# Patient Record
Sex: Female | Born: 1960 | Race: White | Hispanic: No | Marital: Married | Smoking: Former smoker
Health system: Southern US, Community
[De-identification: ages and names within clinical notes are randomized; demographics above are authoritative.]

## PROBLEM LIST (undated history)

## (undated) DIAGNOSIS — M810 Age-related osteoporosis without current pathological fracture: Secondary | ICD-10-CM

## (undated) DIAGNOSIS — E079 Disorder of thyroid, unspecified: Secondary | ICD-10-CM

## (undated) DIAGNOSIS — E785 Hyperlipidemia, unspecified: Secondary | ICD-10-CM

## (undated) DIAGNOSIS — M8580 Other specified disorders of bone density and structure, unspecified site: Secondary | ICD-10-CM

## (undated) DIAGNOSIS — F419 Anxiety disorder, unspecified: Secondary | ICD-10-CM

## (undated) HISTORY — DX: Other specified disorders of bone density and structure, unspecified site: M85.80

## (undated) HISTORY — DX: Disorder of thyroid, unspecified: E07.9

## (undated) HISTORY — DX: Hyperlipidemia, unspecified: E78.5

## (undated) HISTORY — DX: Age-related osteoporosis without current pathological fracture: M81.0

## (undated) HISTORY — PX: LASER ABLATION OF THE CERVIX: SHX1949

## (undated) HISTORY — DX: Anxiety disorder, unspecified: F41.9

---

## 1998-03-31 ENCOUNTER — Other Ambulatory Visit: Admission: RE | Admit: 1998-03-31 | Discharge: 1998-03-31 | Payer: Self-pay | Admitting: Family Medicine

## 1999-07-12 ENCOUNTER — Other Ambulatory Visit: Admission: RE | Admit: 1999-07-12 | Discharge: 1999-07-12 | Payer: Self-pay | Admitting: Family Medicine

## 2000-08-28 ENCOUNTER — Other Ambulatory Visit: Admission: RE | Admit: 2000-08-28 | Discharge: 2000-08-28 | Payer: Self-pay | Admitting: Family Medicine

## 2001-09-10 ENCOUNTER — Other Ambulatory Visit: Admission: RE | Admit: 2001-09-10 | Discharge: 2001-09-10 | Payer: Self-pay | Admitting: Family Medicine

## 2003-01-01 ENCOUNTER — Other Ambulatory Visit: Admission: RE | Admit: 2003-01-01 | Discharge: 2003-01-01 | Payer: Self-pay | Admitting: *Deleted

## 2005-06-08 ENCOUNTER — Other Ambulatory Visit: Admission: RE | Admit: 2005-06-08 | Discharge: 2005-06-08 | Payer: Self-pay | Admitting: Family Medicine

## 2010-01-23 DEATH — deceased

## 2012-04-02 ENCOUNTER — Other Ambulatory Visit: Payer: Self-pay | Admitting: *Deleted

## 2012-04-02 DIAGNOSIS — E039 Hypothyroidism, unspecified: Secondary | ICD-10-CM

## 2012-04-18 ENCOUNTER — Other Ambulatory Visit (INDEPENDENT_AMBULATORY_CARE_PROVIDER_SITE_OTHER): Payer: Medicaid Other

## 2012-04-18 DIAGNOSIS — E039 Hypothyroidism, unspecified: Secondary | ICD-10-CM

## 2012-04-18 NOTE — Progress Notes (Unsigned)
Patient here for labs only. 

## 2012-04-19 ENCOUNTER — Telehealth: Payer: Self-pay | Admitting: Nurse Practitioner

## 2012-04-19 LAB — THYROID PANEL WITH TSH
T3 Uptake: 37.9 % — ABNORMAL HIGH (ref 22.5–37.0)
TSH: 1.338 u[IU]/mL (ref 0.350–4.500)

## 2012-04-19 NOTE — Telephone Encounter (Signed)
Patient aware of labs normal

## 2012-04-24 ENCOUNTER — Ambulatory Visit: Payer: Self-pay

## 2012-04-24 ENCOUNTER — Other Ambulatory Visit: Payer: Self-pay

## 2012-05-06 ENCOUNTER — Encounter: Payer: Self-pay | Admitting: Nurse Practitioner

## 2012-05-06 ENCOUNTER — Ambulatory Visit (INDEPENDENT_AMBULATORY_CARE_PROVIDER_SITE_OTHER): Payer: Medicaid Other | Admitting: Nurse Practitioner

## 2012-05-06 VITALS — BP 113/76 | HR 81 | Temp 97.4°F | Ht 64.0 in | Wt 124.0 lb

## 2012-05-06 DIAGNOSIS — E785 Hyperlipidemia, unspecified: Secondary | ICD-10-CM

## 2012-05-06 DIAGNOSIS — E039 Hypothyroidism, unspecified: Secondary | ICD-10-CM

## 2012-05-06 DIAGNOSIS — F411 Generalized anxiety disorder: Secondary | ICD-10-CM

## 2012-05-06 LAB — COMPLETE METABOLIC PANEL WITH GFR
ALT: 12 U/L (ref 0–35)
AST: 21 U/L (ref 0–37)
Albumin: 4.3 g/dL (ref 3.5–5.2)
Alkaline Phosphatase: 118 U/L — ABNORMAL HIGH (ref 39–117)
Glucose, Bld: 92 mg/dL (ref 70–99)
Potassium: 5.1 mEq/L (ref 3.5–5.3)
Sodium: 142 mEq/L (ref 135–145)
Total Protein: 6.7 g/dL (ref 6.0–8.3)

## 2012-05-06 MED ORDER — LEVOTHYROXINE SODIUM 75 MCG PO TABS: 75.0000 ug | ORAL_TABLET | Freq: Every day | ORAL | Status: DC

## 2012-05-06 MED ORDER — ATORVASTATIN CALCIUM 40 MG PO TABS: 40.0000 mg | ORAL_TABLET | Freq: Every day | ORAL | Status: DC

## 2012-05-06 NOTE — Patient Instructions (Signed)

## 2012-05-06 NOTE — Progress Notes (Signed)
  Subjective:    Patient ID: Jennifer [name], female    DOB: 12/06/1960, 52 y.o.   MRN: [id number]  Hyperlipidemia This is a chronic problem. The current episode started more than 1 year ago. The problem is controlled. Recent lipid tests were reviewed and are normal. Exacerbating diseases include hypothyroidism. Pertinent negatives include no chest pain, leg pain, myalgias or shortness of breath. Current antihyperlipidemic treatment includes statins and exercise. There are no compliance problems.   Anxiety Presents for follow-up visit. Symptoms include excessive worry, nervous/anxious behavior and panic (intermittent 1x/week). Patient reports no chest pain, decreased concentration, depressed mood, dizziness, insomnia, palpitations or shortness of breath. Symptoms occur occasionally. The most recent episode lasted 15 minutes. The severity of symptoms is moderate. The quality of sleep is good. Nighttime awakenings: none.   Compliance with medications is 76-100%. Side effects of treatment include headaches and GI discomfort.  Hypothyroidism Synthroid recently increased to 75 mcg. Pt TSH WNL. No weight or appetite changes.  Pt has no other complaints at this time.    Review of Systems  Respiratory: Negative for shortness of breath.   Cardiovascular: Negative for chest pain and palpitations.  Musculoskeletal: Negative for myalgias.  Neurological: Negative for dizziness.  Psychiatric/Behavioral: Negative for decreased concentration. The patient is nervous/anxious. The patient does not have insomnia.    BP 113/76  Pulse 81  Temp(Src) 97.4 F (36.3 C) (Oral)  Ht 5\' 4"  (1.626 m)  Wt 124 lb (56.246 kg)  BMI 21.27 kg/m2     Objective:   Physical Exam  Constitutional: She is oriented to person, place, and time. She appears well-developed and well-nourished.  HENT:  Nose: Nose normal.  Mouth/Throat: Oropharynx is clear and moist.  Eyes: EOM are normal.  Neck: Trachea normal, normal range of  motion and full passive range of motion without pain. Neck supple. No JVD present. Carotid bruit is not present. No thyromegaly present.  Cardiovascular: Normal rate, regular rhythm, normal heart sounds and intact distal pulses.  Exam reveals no gallop and no friction rub.   No murmur heard. Pulmonary/Chest: Effort normal. She has wheezes.  Abdominal: Soft. Bowel sounds are normal. She exhibits no distension and no mass. There is no tenderness.  Musculoskeletal: Normal range of motion.  Lymphadenopathy:    She has no cervical adenopathy.  Neurological: She is alert and oriented to person, place, and time. She has normal reflexes.  Skin: Skin is warm and dry.  Psychiatric: She has a normal mood and affect. Her behavior is normal. Judgment and thought content normal.          Assessment & Plan:

## 2012-05-08 LAB — NMR LIPOPROFILE WITH LIPIDS
Cholesterol, Total: 112 mg/dL (ref <200)
HDL Particle Number: 31.7 umol/L (ref >=30.5)
HDL-C: 39 mg/dL — ABNORMAL LOW (ref >=40)
Large HDL-P: 3.7 umol/L — ABNORMAL LOW (ref >=4.8)
Large VLDL-P: 3.2 nmol/L — ABNORMAL HIGH (ref <=2.7)
Triglycerides: 124 mg/dL (ref <150)

## 2012-06-12 ENCOUNTER — Ambulatory Visit: Payer: Self-pay

## 2012-06-12 ENCOUNTER — Other Ambulatory Visit: Payer: Self-pay

## 2012-08-08 ENCOUNTER — Ambulatory Visit (INDEPENDENT_AMBULATORY_CARE_PROVIDER_SITE_OTHER): Payer: Medicaid Other | Admitting: Family Medicine

## 2012-08-08 ENCOUNTER — Encounter: Payer: Self-pay | Admitting: Family Medicine

## 2012-08-08 VITALS — BP 110/67 | HR 87 | Temp 97.6°F | Wt 125.2 lb

## 2012-08-08 DIAGNOSIS — J209 Acute bronchitis, unspecified: Secondary | ICD-10-CM

## 2012-08-08 MED ORDER — AZITHROMYCIN 250 MG PO TABS: ORAL_TABLET | ORAL | Status: DC

## 2012-08-08 MED ORDER — ALBUTEROL SULFATE HFA 108 (90 BASE) MCG/ACT IN AERS: 2.0000 | INHALATION_SPRAY | Freq: Four times a day (QID) | RESPIRATORY_TRACT | Status: DC | PRN

## 2012-08-08 NOTE — Patient Instructions (Signed)
Vertigo Vertigo means you feel like you or your surroundings are moving when they are not. Vertigo can be dangerous if it occurs when you are at work, driving, or performing difficult activities.  CAUSES  Vertigo occurs when there is a conflict of signals sent to your brain from the visual and sensory systems in your body. There are many different causes of vertigo, including:  Infections, especially in the inner ear.  A bad reaction to a drug or misuse of alcohol and medicines.  Withdrawal from drugs or alcohol.  Rapidly changing positions, such as lying down or rolling over in bed.  A migraine headache.  Decreased blood flow to the brain.  Increased pressure in the brain from a head injury, infection, tumor, or bleeding. SYMPTOMS  You may feel as though the world is spinning around or you are falling to the ground. Because your balance is upset, vertigo can cause nausea and vomiting. You may have involuntary eye movements (nystagmus). DIAGNOSIS  Vertigo is usually diagnosed by physical exam. If the cause of your vertigo is unknown, your caregiver may perform imaging tests, such as an MRI scan (magnetic resonance imaging). TREATMENT  Most cases of vertigo resolve on their own, without treatment. Depending on the cause, your caregiver may prescribe certain medicines. If your vertigo is related to body position issues, your caregiver may recommend movements or procedures to correct the problem. In rare cases, if your vertigo is caused by certain inner ear problems, you may need surgery. HOME CARE INSTRUCTIONS   Follow your caregiver's instructions.  Avoid driving.  Avoid operating heavy machinery.  Avoid performing any tasks that would be dangerous to you or others during a vertigo episode.  Tell your caregiver if you notice that certain medicines seem to be causing your vertigo. Some of the medicines used to treat vertigo episodes can actually make them worse in some people. SEEK  IMMEDIATE MEDICAL CARE IF:   Your medicines do not relieve your vertigo or are making it worse.  You develop problems with talking, walking, weakness, or using your arms, hands, or legs.  You develop severe headaches.  Your nausea or vomiting continues or gets worse.  You develop visual changes.  A family member notices behavioral changes.  Your condition gets worse. MAKE SURE YOU:  Understand these instructions.  Will watch your condition.  Will get help right away if you are not doing well or get worse. Document Released: 10/19/2004 Document Revised: 04/03/2011 Document Reviewed: 07/28/2010 [employer] Patient Information 2014 [employer].  

## 2012-08-08 NOTE — Progress Notes (Signed)
  Subjective:    Patient ID: Jennifer [name], female    DOB: 01/05/1961, 52 y.o.   MRN: [id number]  HPI  This 52 y.o. female presents for evaluation of dizziness,cough, and congestion..  Review of Systems No chest pain, SOB, HA, dizziness, vision change, N/V, diarrhea, constipation, dysuria, urinary urgency or frequency, myalgias, arthralgias or rash.     Objective:   Physical Exam  Vital signs noted  Well developed well nourished female.  HEENT - Head atraumatic Normocephalic                Eyes - PERRLA, Conjuctiva - clear Sclera- Clear EOMI                Ears - EAC's Wnl TM's Wnl Gross Hearing WNL                Nose - Nares patent                 Throat - oropharanx wnl Respiratory - Lungs CTA bilateral Cardiac - RRR S1 and S2 without murmur       Assessment & Plan:  Acute bronchitis - Plan: azithromycin (ZITHROMAX) 250 MG tablet, albuterol (PROVENTIL HFA;VENTOLIN HFA) 108 (90 BASE) MCG/ACT inhaler Informed patient she should quit smoking.  Robitussin DM cough syrup recommended.  Vertigo - Drink plenty of fluid, recommend antivert but patient wants to wait.  Discussed that she should turn head slowly and Follow up prn if sx's persist.

## 2012-09-09 ENCOUNTER — Other Ambulatory Visit: Payer: Self-pay

## 2012-09-09 DIAGNOSIS — E039 Hypothyroidism, unspecified: Secondary | ICD-10-CM

## 2012-09-09 MED ORDER — LEVOTHYROXINE SODIUM 75 MCG PO TABS: 75.0000 ug | ORAL_TABLET | Freq: Every day | ORAL | Status: DC

## 2012-10-14 ENCOUNTER — Encounter: Payer: Self-pay | Admitting: Nurse Practitioner

## 2012-10-14 ENCOUNTER — Ambulatory Visit (INDEPENDENT_AMBULATORY_CARE_PROVIDER_SITE_OTHER): Payer: Medicaid Other | Admitting: Nurse Practitioner

## 2012-10-14 VITALS — BP 119/77 | HR 91 | Temp 97.8°F | Ht 64.0 in | Wt 128.0 lb

## 2012-10-14 DIAGNOSIS — E785 Hyperlipidemia, unspecified: Secondary | ICD-10-CM

## 2012-10-14 DIAGNOSIS — E039 Hypothyroidism, unspecified: Secondary | ICD-10-CM

## 2012-10-14 DIAGNOSIS — F411 Generalized anxiety disorder: Secondary | ICD-10-CM

## 2012-10-14 MED ORDER — LEVOTHYROXINE SODIUM 75 MCG PO TABS: 75.0000 ug | ORAL_TABLET | Freq: Every day | ORAL | Status: DC

## 2012-10-14 MED ORDER — ATORVASTATIN CALCIUM 40 MG PO TABS: 40.0000 mg | ORAL_TABLET | Freq: Every day | ORAL | Status: DC

## 2012-10-14 NOTE — Patient Instructions (Addendum)

## 2012-10-14 NOTE — Progress Notes (Signed)
Subjective:    Patient ID: Jennifer [name], female    DOB: Mar 17, 1960, 52 y.o.   MRN: [id number]  Hyperlipidemia This is a chronic problem. The current episode started more than 1 year ago. The problem is controlled. Recent lipid tests were reviewed and are normal. Exacerbating diseases include hypothyroidism. Pertinent negatives include no chest pain, leg pain, myalgias or shortness of breath. Current antihyperlipidemic treatment includes statins and exercise. There are no compliance problems.   Anxiety Presents for follow-up visit. Symptoms include excessive worry, nervous/anxious behavior and panic (intermittent 1x/week). Patient reports no chest pain, decreased concentration, depressed mood, dizziness, insomnia, palpitations or shortness of breath. Symptoms occur occasionally. The most recent episode lasted 15 minutes. The severity of symptoms is moderate. The quality of sleep is good. Nighttime awakenings: none.   Compliance with medications is 76-100%. Side effects of treatment include headaches and GI discomfort.  Hypothyroidism Synthroid recently increased to 75 mcg. Pt TSH WNL. No weight or appetite changes.  Pt has no other complaints at this time.    Review of Systems  Respiratory: Negative for shortness of breath.   Cardiovascular: Negative for chest pain and palpitations.  Musculoskeletal: Negative for myalgias.  Neurological: Negative for dizziness.  Psychiatric/Behavioral: Negative for decreased concentration. The patient is nervous/anxious. The patient does not have insomnia.    BP 119/77  Pulse 91  Temp(Src) 97.8 F (36.6 C) (Oral)  Ht 5\' 4"  (1.626 m)  Wt 128 lb (58.06 kg)  BMI 21.96 kg/m2     Objective:   Physical Exam  Constitutional: She is oriented to person, place, and time. She appears well-developed and well-nourished.  HENT:  Nose: Nose normal.  Mouth/Throat: Oropharynx is clear and moist.  Eyes: EOM are normal.  Neck: Trachea normal, normal range of  motion and full passive range of motion without pain. Neck supple. No JVD present. Carotid bruit is not present. No thyromegaly present.  Cardiovascular: Normal rate, regular rhythm, normal heart sounds and intact distal pulses.  Exam reveals no gallop and no friction rub.   No murmur heard. Pulmonary/Chest: Effort normal. She has wheezes.  Abdominal: Soft. Bowel sounds are normal. She exhibits no distension and no mass. There is no tenderness.  Musculoskeletal: Normal range of motion.  Lymphadenopathy:    She has no cervical adenopathy.  Neurological: She is alert and oriented to person, place, and time. She has normal reflexes.  Skin: Skin is warm and dry.  Psychiatric: She has a normal mood and affect. Her behavior is normal. Judgment and thought content normal.   BP 119/77  Pulse 91  Temp(Src) 97.8 F (36.6 C) (Oral)  Ht 5\' 4"  (1.626 m)  Wt 128 lb (58.06 kg)  BMI 21.96 kg/m2        Assessment & Plan:   1. Unspecified hypothyroidism   2. Other and unspecified hyperlipidemia   3. GAD (generalized anxiety disorder)    Orders Placed This Encounter  Procedures  . CMP14+EGFR  . NMR, lipoprofile  . Thyroid Panel With TSH   Meds ordered this encounter  Medications  . levothyroxine (SYNTHROID, LEVOTHROID) 75 MCG tablet    Sig: Take 1 tablet (75 mcg total) by mouth daily before breakfast.    Dispense:  30 tablet    Refill:  6    Order Specific Question:  Supervising Provider    Answer:  Ernestina Penna [[id number]]  . atorvastatin (LIPITOR) 40 MG tablet    Sig: Take 1 tablet (40 mg total) by mouth daily.  Dispense:  30 tablet    Refill:  5    Order Specific Question:  Supervising Provider    Answer:  Ernestina Penna [[id number]]   Contiunue current meds Labs pending Diet and exercise encouraged  Health maintenance reviewed  Mary-Margaret Daphine Deutscher, FNP

## 2012-10-16 LAB — CMP14+EGFR
ALT: 13 IU/L (ref 0–32)
AST: 22 IU/L (ref 0–40)
Albumin/Globulin Ratio: 2.4 (ref 1.1–2.5)
Alkaline Phosphatase: 133 IU/L — ABNORMAL HIGH (ref 39–117)
BUN/Creatinine Ratio: 10 (ref 9–23)
Chloride: 104 mmol/L (ref 97–108)
GFR calc Af Amer: 110 mL/min/{1.73_m2} (ref >59)
GFR calc non Af Amer: 95 mL/min/{1.73_m2} (ref >59)
Glucose: 100 mg/dL — ABNORMAL HIGH (ref 65–99)
Potassium: 4.8 mmol/L (ref 3.5–5.2)
Sodium: 144 mmol/L (ref 134–144)
Total Bilirubin: 0.4 mg/dL (ref 0.0–1.2)
Total Protein: 7.1 g/dL (ref 6.0–8.5)

## 2012-10-16 LAB — NMR, LIPOPROFILE
LDL Particle Number: 1091 nmol/L — ABNORMAL HIGH (ref <1000)
LDL Size: 19.9 nm — ABNORMAL LOW (ref >20.5)
LP-IR Score: 55 — ABNORMAL HIGH (ref <=45)
Small LDL Particle Number: 895 nmol/L — ABNORMAL HIGH (ref <=527)
Triglycerides by NMR: 138 mg/dL (ref <150)

## 2012-10-16 LAB — THYROID PANEL WITH TSH
T3 Uptake Ratio: 28 % (ref 24–39)
T4, Total: 9.2 ug/dL (ref 4.5–12.0)
TSH: 1.1 u[IU]/mL (ref 0.450–4.500)

## 2012-10-18 ENCOUNTER — Telehealth: Payer: Self-pay | Admitting: Nurse Practitioner

## 2012-10-21 NOTE — Telephone Encounter (Signed)
Aware. 

## 2013-03-10 ENCOUNTER — Ambulatory Visit: Payer: Medicaid Other | Admitting: Nurse Practitioner

## 2013-04-28 ENCOUNTER — Telehealth: Payer: Self-pay | Admitting: Nurse Practitioner

## 2013-04-28 DIAGNOSIS — E039 Hypothyroidism, unspecified: Secondary | ICD-10-CM

## 2013-04-28 DIAGNOSIS — E785 Hyperlipidemia, unspecified: Secondary | ICD-10-CM

## 2013-04-30 MED ORDER — ATORVASTATIN CALCIUM 40 MG PO TABS: 40.0000 mg | ORAL_TABLET | Freq: Every day | ORAL | Status: DC

## 2013-04-30 MED ORDER — LEVOTHYROXINE SODIUM 75 MCG PO TABS: 75.0000 ug | ORAL_TABLET | Freq: Every day | ORAL | Status: DC

## 2013-04-30 NOTE — Telephone Encounter (Signed)
done

## 2013-05-02 ENCOUNTER — Ambulatory Visit (INDEPENDENT_AMBULATORY_CARE_PROVIDER_SITE_OTHER): Payer: Medicaid Other | Admitting: Nurse Practitioner

## 2013-05-02 ENCOUNTER — Encounter: Payer: Self-pay | Admitting: Nurse Practitioner

## 2013-05-02 VITALS — BP 127/74 | HR 98 | Temp 98.1°F | Ht 64.0 in | Wt 129.2 lb

## 2013-05-02 DIAGNOSIS — F411 Generalized anxiety disorder: Secondary | ICD-10-CM

## 2013-05-02 DIAGNOSIS — E039 Hypothyroidism, unspecified: Secondary | ICD-10-CM

## 2013-05-02 DIAGNOSIS — J209 Acute bronchitis, unspecified: Secondary | ICD-10-CM

## 2013-05-02 DIAGNOSIS — E785 Hyperlipidemia, unspecified: Secondary | ICD-10-CM

## 2013-05-02 MED ORDER — ATORVASTATIN CALCIUM 40 MG PO TABS: 40.0000 mg | ORAL_TABLET | Freq: Every day | ORAL | Status: DC

## 2013-05-02 MED ORDER — ALBUTEROL SULFATE HFA 108 (90 BASE) MCG/ACT IN AERS: 2.0000 | INHALATION_SPRAY | Freq: Four times a day (QID) | RESPIRATORY_TRACT | Status: DC | PRN

## 2013-05-02 NOTE — Patient Instructions (Signed)

## 2013-05-02 NOTE — Progress Notes (Signed)
Subjective:    Patient ID: Jennifer [name], female    DOB: 29-Nov-1960, 53 y.o.   MRN: [id number]  Patient in today for follow up of chronic medical problems.  Hyperlipidemia This is a chronic problem. The current episode started more than 1 year ago. The problem is controlled. Recent lipid tests were reviewed and are normal. Exacerbating diseases include hypothyroidism. Pertinent negatives include no chest pain, leg pain, myalgias or shortness of breath. Current antihyperlipidemic treatment includes statins and exercise. There are no compliance problems.   Anxiety Presents for follow-up visit. Symptoms include excessive worry, nervous/anxious behavior and panic (intermittent 1x/week). Patient reports no chest pain, decreased concentration, depressed mood, dizziness, insomnia, palpitations or shortness of breath. Symptoms occur occasionally. The most recent episode lasted 15 minutes. The severity of symptoms is moderate. The quality of sleep is good. Nighttime awakenings: none.   Compliance with medications is 76-100%. Side effects of treatment include headaches and GI discomfort.  Hypothyroidism Synthroid recently increased to 75 mcg. Pt TSH WNL. No weight or appetite changes.      Review of Systems  Respiratory: Negative for shortness of breath.   Cardiovascular: Negative for chest pain and palpitations.  Musculoskeletal: Negative for myalgias.  Neurological: Negative for dizziness.  Psychiatric/Behavioral: Negative for decreased concentration. The patient is nervous/anxious. The patient does not have insomnia.    BP 127/74  Pulse 98  Temp(Src) 98.1 F (36.7 C) (Oral)  Ht $R'5\' 4"'ci$  (1.626 m)  Wt 129 lb 3.2 oz (58.605 kg)  BMI 22.17 kg/m2     Objective:   Physical Exam  Constitutional: She is oriented to person, place, and time. She appears well-developed and well-nourished.  HENT:  Nose: Nose normal.  Mouth/Throat: Oropharynx is clear and moist.  Eyes: EOM are normal.  Neck:  Trachea normal, normal range of motion and full passive range of motion without pain. Neck supple. No JVD present. Carotid bruit is not present. No thyromegaly present.  Cardiovascular: Normal rate, regular rhythm, normal heart sounds and intact distal pulses.  Exam reveals no gallop and no friction rub.   No murmur heard. Pulmonary/Chest: Effort normal. She has wheezes.  Abdominal: Soft. Bowel sounds are normal. She exhibits no distension and no mass. There is no tenderness.  Musculoskeletal: Normal range of motion.  Lymphadenopathy:    She has no cervical adenopathy.  Neurological: She is alert and oriented to person, place, and time. She has normal reflexes.  Skin: Skin is warm and dry.  Psychiatric: She has a normal mood and affect. Her behavior is normal. Judgment and thought content normal.   BP 127/74  Pulse 98  Temp(Src) 98.1 F (36.7 C) (Oral)  Ht $R'5\' 4"'Dz$  (1.626 m)  Wt 129 lb 3.2 oz (58.605 kg)  BMI 22.17 kg/m2        Assessment & Plan:   1. Unspecified hypothyroidism   2. Other and unspecified hyperlipidemia   3. GAD (generalized anxiety disorder)   4. Acute bronchitis    Orders Placed This Encounter  Procedures  . CMP14+EGFR  . NMR, lipoprofile  . Thyroid Panel With TSH   Meds ordered this encounter  Medications  . atorvastatin (LIPITOR) 40 MG tablet    Sig: Take 1 tablet (40 mg total) by mouth daily.    Dispense:  30 tablet    Refill:  5    Order Specific Question:  Supervising Provider    Answer:  Chipper Herb [[id number]]  . albuterol (PROVENTIL HFA;VENTOLIN HFA) 108 (90 BASE) MCG/ACT inhaler  Sig: Inhale 2 puffs into the lungs every 6 (six) hours as needed for wheezing.    Dispense:  1 Inhaler    Refill:  0    Order Specific Question:  Supervising Provider    Answer:  Chipper Herb [place] pending Health maintenance reviewed Diet and exercise encouraged Continue all meds Follow up  In 3 months   [place], FNP

## 2013-05-04 LAB — CMP14+EGFR
A/G RATIO: 2.3 (ref 1.1–2.5)
ALT: 11 IU/L (ref 0–32)
AST: 15 IU/L (ref 0–40)
Albumin: 4.5 g/dL (ref 3.5–5.5)
Alkaline Phosphatase: 125 IU/L — ABNORMAL HIGH (ref 39–117)
BUN/Creatinine Ratio: 10 (ref 9–23)
BUN: 7 mg/dL (ref 6–24)
CALCIUM: 9.1 mg/dL (ref 8.7–10.2)
CO2: 26 mmol/L (ref 18–29)
CREATININE: 0.71 mg/dL (ref 0.57–1.00)
Chloride: 105 mmol/L (ref 97–108)
GFR calc Af Amer: 111 mL/min/{1.73_m2} (ref >59)
GFR, EST NON AFRICAN AMERICAN: 96 mL/min/{1.73_m2} (ref >59)
GLUCOSE: 95 mg/dL (ref 65–99)
Globulin, Total: 2 g/dL (ref 1.5–4.5)
POTASSIUM: 3.7 mmol/L (ref 3.5–5.2)
Sodium: 146 mmol/L — ABNORMAL HIGH (ref 134–144)
TOTAL PROTEIN: 6.5 g/dL (ref 6.0–8.5)
Total Bilirubin: 0.3 mg/dL (ref 0.0–1.2)

## 2013-05-04 LAB — NMR, LIPOPROFILE
Cholesterol: 110 mg/dL (ref <200)
HDL Cholesterol by NMR: 36 mg/dL — ABNORMAL LOW (ref >=40)
HDL Particle Number: 30.3 umol/L — ABNORMAL LOW (ref >=30.5)
LDL PARTICLE NUMBER: 883 nmol/L (ref <1000)
LDL SIZE: 19.9 nm — AB (ref >20.5)
LDLC SERPL CALC-MCNC: 49 mg/dL (ref <100)
LP-IR SCORE: 61 — AB (ref <=45)
SMALL LDL PARTICLE NUMBER: 733 nmol/L — AB (ref <=527)
TRIGLYCERIDES BY NMR: 124 mg/dL (ref <150)

## 2013-05-04 LAB — THYROID PANEL WITH TSH
Free Thyroxine Index: 1.9 (ref 1.2–4.9)
T3 UPTAKE RATIO: 26 % (ref 24–39)
T4, Total: 7.4 ug/dL (ref 4.5–12.0)
TSH: 1.5 u[IU]/mL (ref 0.450–4.500)

## 2013-09-30 ENCOUNTER — Other Ambulatory Visit: Payer: Self-pay | Admitting: Nurse Practitioner

## 2013-10-03 ENCOUNTER — Encounter: Payer: Self-pay | Admitting: Nurse Practitioner

## 2013-10-03 ENCOUNTER — Ambulatory Visit (INDEPENDENT_AMBULATORY_CARE_PROVIDER_SITE_OTHER): Payer: Medicaid Other | Admitting: Nurse Practitioner

## 2013-10-03 VITALS — BP 114/66 | HR 112 | Temp 98.4°F | Ht 64.0 in | Wt 130.8 lb

## 2013-10-03 DIAGNOSIS — E785 Hyperlipidemia, unspecified: Secondary | ICD-10-CM

## 2013-10-03 DIAGNOSIS — E039 Hypothyroidism, unspecified: Secondary | ICD-10-CM

## 2013-10-03 DIAGNOSIS — F411 Generalized anxiety disorder: Secondary | ICD-10-CM

## 2013-10-03 MED ORDER — ATORVASTATIN CALCIUM 40 MG PO TABS: 40.0000 mg | ORAL_TABLET | Freq: Every day | ORAL | Status: DC

## 2013-10-03 MED ORDER — LEVOTHYROXINE SODIUM 75 MCG PO TABS: ORAL_TABLET | ORAL | Status: DC

## 2013-10-03 NOTE — Progress Notes (Signed)
Subjective:    Patient ID: Jennifer [name], female    DOB: February 08, 1960, 53 y.o.   MRN: [id number]  Patient in today for follow up of chronic medical problems. No acute complaint.  Hyperlipidemia This is a chronic problem. The current episode started more than 1 year ago. The problem is controlled. Recent lipid tests were reviewed and are normal. Exacerbating diseases include hypothyroidism. Pertinent negatives include no chest pain, leg pain, myalgias or shortness of breath. Current antihyperlipidemic treatment includes statins and exercise. There are no compliance problems.   Anxiety Presents for follow-up visit. Symptoms include excessive worry, nervous/anxious behavior and panic (intermittent 1x/week). Patient reports no chest pain, decreased concentration, depressed mood, dizziness, insomnia, palpitations or shortness of breath. Symptoms occur occasionally. The most recent episode lasted 15 minutes. The severity of symptoms is moderate. The quality of sleep is good. Nighttime awakenings: none.   Compliance with medications is 76-100%. Side effects of treatment include headaches and GI discomfort.  Hypothyroidism Synthroid recently increased to 75 mcg. Pt TSH WNL. No weight or appetite changes.     Review of Systems  Respiratory: Negative for shortness of breath.   Cardiovascular: Negative for chest pain and palpitations.  Musculoskeletal: Negative for myalgias.  Neurological: Negative for dizziness.  Psychiatric/Behavioral: Negative for decreased concentration. The patient is nervous/anxious. The patient does not have insomnia.    BP 114/66  Pulse 112  Temp(Src) 98.4 F (36.9 C) (Oral)  Ht $R'5\' 4"'qH$  (1.626 m)  Wt 130 lb 12.8 oz (59.33 kg)  BMI 22.44 kg/m2     Objective:   Physical Exam  Constitutional: She is oriented to person, place, and time. She appears well-developed and well-nourished.  HENT:  Nose: Nose normal.  Mouth/Throat: Oropharynx is clear and moist.  Eyes: EOM are  normal.  Neck: Trachea normal, normal range of motion and full passive range of motion without pain. Neck supple. No JVD present. Carotid bruit is not present. No thyromegaly present.  Cardiovascular: Normal rate, regular rhythm, normal heart sounds and intact distal pulses.  Exam reveals no gallop and no friction rub.   No murmur heard. Pulmonary/Chest: Effort normal. She has wheezes.  Abdominal: Soft. Bowel sounds are normal. She exhibits no distension and no mass. There is no tenderness.  Musculoskeletal: Normal range of motion.  Lymphadenopathy:    She has no cervical adenopathy.  Neurological: She is alert and oriented to person, place, and time. She has normal reflexes.  Skin: Skin is warm and dry.  Psychiatric: She has a normal mood and affect. Her behavior is normal. Judgment and thought content normal.   BP 114/66  Pulse 112  Temp(Src) 98.4 F (36.9 C) (Oral)  Ht $R'5\' 4"'QJ$  (1.626 m)  Wt 130 lb 12.8 oz (59.33 kg)  BMI 22.44 kg/m2        Assessment & Plan:   1. Unspecified hypothyroidism   2. Other and unspecified hyperlipidemia   3. GAD (generalized anxiety disorder)    Orders Placed This Encounter  Procedures  . CMP14+EGFR  . NMR, lipoprofile  . Thyroid Panel With TSH   Meds ordered this encounter  Medications  . atorvastatin (LIPITOR) 40 MG tablet    Sig: Take 1 tablet (40 mg total) by mouth daily.    Dispense:  30 tablet    Refill:  5    Order Specific Question:  Supervising Provider    Answer:  Chipper Herb [[id number]]  . levothyroxine (SYNTHROID, LEVOTHROID) 75 MCG tablet    Sig: TAKE 1 TABLET  DAILY BEFORE BREAKFAST    Dispense:  30 tablet    Refill:  3    Order Specific Question:  Supervising Provider    Answer:  Chipper Herb [[id number]]    hemoccult cards given to patient with directions Labs pending Health maintenance reviewed Diet and exercise encouraged Continue all meds Follow up  In 3 months    [place], FNP

## 2013-10-03 NOTE — Patient Instructions (Signed)

## 2013-10-04 LAB — CMP14+EGFR
ALBUMIN: 4.4 g/dL (ref 3.5–5.5)
ALK PHOS: 131 IU/L — AB (ref 39–117)
ALT: 14 IU/L (ref 0–32)
AST: 16 IU/L (ref 0–40)
Albumin/Globulin Ratio: 2.2 (ref 1.1–2.5)
BILIRUBIN TOTAL: 0.4 mg/dL (ref 0.0–1.2)
BUN / CREAT RATIO: 8 — AB (ref 9–23)
BUN: 6 mg/dL (ref 6–24)
CHLORIDE: 103 mmol/L (ref 97–108)
CO2: 25 mmol/L (ref 18–29)
Calcium: 9.2 mg/dL (ref 8.7–10.2)
Creatinine, Ser: 0.71 mg/dL (ref 0.57–1.00)
GFR calc non Af Amer: 96 mL/min/{1.73_m2} (ref >59)
GFR, EST AFRICAN AMERICAN: 111 mL/min/{1.73_m2} (ref >59)
Globulin, Total: 2 g/dL (ref 1.5–4.5)
Glucose: 96 mg/dL (ref 65–99)
Potassium: 3.9 mmol/L (ref 3.5–5.2)
SODIUM: 143 mmol/L (ref 134–144)
Total Protein: 6.4 g/dL (ref 6.0–8.5)

## 2013-10-04 LAB — NMR, LIPOPROFILE
Cholesterol: 108 mg/dL (ref 100–199)
HDL Cholesterol by NMR: 40 mg/dL (ref >39)
HDL PARTICLE NUMBER: 32 umol/L (ref >=30.5)
LDL Particle Number: 641 nmol/L (ref <1000)
LDL SIZE: 20.3 nm (ref >20.5)
LDLC SERPL CALC-MCNC: 46 mg/dL (ref 0–99)
LP-IR SCORE: 54 — AB (ref <=45)
SMALL LDL PARTICLE NUMBER: 291 nmol/L (ref <=527)
Triglycerides by NMR: 109 mg/dL (ref 0–149)

## 2013-10-04 LAB — THYROID PANEL WITH TSH
Free Thyroxine Index: 2.6 (ref 1.2–4.9)
T3 Uptake Ratio: 30 % (ref 24–39)
T4, Total: 8.7 ug/dL (ref 4.5–12.0)
TSH: 1.09 u[IU]/mL (ref 0.450–4.500)

## 2014-02-26 ENCOUNTER — Ambulatory Visit: Payer: Medicaid Other | Admitting: Nurse Practitioner

## 2014-03-02 ENCOUNTER — Ambulatory Visit (INDEPENDENT_AMBULATORY_CARE_PROVIDER_SITE_OTHER): Payer: Medicaid Other

## 2014-03-02 ENCOUNTER — Encounter: Payer: Self-pay | Admitting: Nurse Practitioner

## 2014-03-02 ENCOUNTER — Ambulatory Visit (INDEPENDENT_AMBULATORY_CARE_PROVIDER_SITE_OTHER): Payer: Medicaid Other | Admitting: Nurse Practitioner

## 2014-03-02 VITALS — BP 136/82 | HR 118 | Temp 97.1°F | Ht 64.0 in | Wt 127.0 lb

## 2014-03-02 DIAGNOSIS — E039 Hypothyroidism, unspecified: Secondary | ICD-10-CM

## 2014-03-02 DIAGNOSIS — E785 Hyperlipidemia, unspecified: Secondary | ICD-10-CM

## 2014-03-02 DIAGNOSIS — Z720 Tobacco use: Secondary | ICD-10-CM

## 2014-03-02 DIAGNOSIS — F17200 Nicotine dependence, unspecified, uncomplicated: Secondary | ICD-10-CM

## 2014-03-02 DIAGNOSIS — J439 Emphysema, unspecified: Secondary | ICD-10-CM

## 2014-03-02 MED ORDER — LEVOTHYROXINE SODIUM 75 MCG PO TABS: 75.0000 ug | ORAL_TABLET | Freq: Every day | ORAL | Status: DC

## 2014-03-02 MED ORDER — ALBUTEROL SULFATE HFA 108 (90 BASE) MCG/ACT IN AERS: 2.0000 | INHALATION_SPRAY | Freq: Four times a day (QID) | RESPIRATORY_TRACT | Status: DC | PRN

## 2014-03-02 MED ORDER — ATORVASTATIN CALCIUM 40 MG PO TABS: 40.0000 mg | ORAL_TABLET | Freq: Every day | ORAL | Status: DC

## 2014-03-02 MED ORDER — LEVALBUTEROL HCL 1.25 MG/3ML IN NEBU: 1.2500 mg | INHALATION_SOLUTION | RESPIRATORY_TRACT | Status: AC

## 2014-03-02 NOTE — Progress Notes (Signed)
Subjective:    Patient ID: Jennifer [name], female    DOB: 09/24/1960, 54 y.o.   MRN: [id number]  Patient in today for follow up of chronic medical problems. She is c/o wheezing mainly at night. Denies cough or congestion.She is still smoking.  Hyperlipidemia This is a chronic problem. The current episode started more than 1 year ago. The problem is uncontrolled. Recent lipid tests were reviewed and are variable. Pertinent negatives include no chest pain, myalgias or shortness of breath. Current antihyperlipidemic treatment includes statins. The current treatment provides no improvement of lipids. Compliance problems include adherence to diet and adherence to exercise.  Risk factors for coronary artery disease include dyslipidemia, hypertension and post-menopausal.  Anxiety Symptoms include nervous/anxious behavior. Patient reports no chest pain, decreased concentration, dizziness, palpitations or shortness of breath.    Thyroid Problem Presents for follow-up (hypothyroidism) visit. Symptoms include anxiety and hoarse voice. Patient reports no palpitations. The symptoms have been stable. Her past medical history is significant for hyperlipidemia.     Review of Systems  Constitutional: Negative.   HENT: Positive for hoarse voice.   Respiratory: Positive for wheezing. Negative for shortness of breath.   Cardiovascular: Negative for chest pain and palpitations.  Musculoskeletal: Negative for myalgias.  Neurological: Negative for dizziness.  Psychiatric/Behavioral: Negative for decreased concentration. The patient is nervous/anxious.         Objective:   Physical Exam  Constitutional: She is oriented to person, place, and time. She appears well-developed and well-nourished.  HENT:  Nose: Nose normal.  Mouth/Throat: Oropharynx is clear and moist.  Eyes: EOM are normal.  Neck: Trachea normal, normal range of motion and full passive range of motion without pain. Neck supple. No JVD  present. Carotid bruit is not present. No thyromegaly present.  Cardiovascular: Normal rate, regular rhythm, normal heart sounds and intact distal pulses.  Exam reveals no gallop and no friction rub.   No murmur heard. Pulmonary/Chest: Effort normal. She has wheezes.  Abdominal: Soft. Bowel sounds are normal. She exhibits no distension and no mass. There is no tenderness.  Musculoskeletal: Normal range of motion.  Lymphadenopathy:    She has no cervical adenopathy.  Neurological: She is alert and oriented to person, place, and time. She has normal reflexes.  Skin: Skin is warm and dry.  Psychiatric: She has a normal mood and affect. Her behavior is normal. Judgment and thought content normal.   BP 136/82 mmHg  Pulse 118  Temp(Src) 97.1 F (36.2 C) (Oral)  Ht 5\' 4"  (1.626 m)  Wt 127 lb (57.607 kg)  BMI 21.79 kg/m2   Chest xray- normal-Preliminary reading by Paulene FloorMary Alyssa Rotondo, FNP  [facility] Pre and post spirometry- no change with xopenex treatment-Mary-Margaret Daphine DeutscherMartin, FNP      Assessment & Plan:    1. Smoker Smoking cessation - DG Chest 2 View; Future - PR EVAL OF BRONCHOSPASM - levalbuterol (XOPENEX) nebulizer solution 1.25 mg; Take 1.25 mg by nebulization now.  2. Hyperlipidemia with target LDL less than 100 Low fat diet - CMP14+EGFR - NMR, lipoprofile - atorvastatin (LIPITOR) 40 MG tablet; Take 1 tablet (40 mg total) by mouth daily.  Dispense: 30 tablet; Refill: 5  3. Hypothyroidism, unspecified hypothyroidism type - Thyroid Panel With TSH - levothyroxine (SYNTHROID, LEVOTHROID) 75 MCG tablet; Take 1 tablet (75 mcg total) by mouth daily before breakfast.  Dispense: 30 tablet; Refill: 5  4. Pulmonary emphysema, unspecified emphysema type again encouraged smoking cessation    Labs pending Health maintenance reviewed Diet and exercise  encouraged Continue all meds Follow up  In 3 month   [place], FNP

## 2014-03-02 NOTE — Addendum Note (Signed)
Addended by: Bennie PieriniMARTIN, MARY-MARGARET on: 03/02/2014 03:23 PM   Modules accepted: Orders

## 2014-03-02 NOTE — Patient Instructions (Signed)
Smoking Cessation Quitting smoking is important to your health and has many advantages. However, it is not always easy to quit since nicotine is a very addictive drug. Oftentimes, people try 3 times or more before being able to quit. This document explains the best ways for you to prepare to quit smoking. Quitting takes hard work and a lot of effort, but you can do it. ADVANTAGES OF QUITTING SMOKING  You will live longer, feel better, and live better.  Your body will feel the impact of quitting smoking almost immediately.  Within 20 minutes, blood pressure decreases. Your pulse returns to its normal level.  After 8 hours, carbon monoxide levels in the blood return to normal. Your oxygen level increases.  After 24 hours, the chance of having a heart attack starts to decrease. Your breath, hair, and body stop smelling like smoke.  After 48 hours, damaged nerve endings begin to recover. Your sense of taste and smell improve.  After 72 hours, the body is virtually free of nicotine. Your bronchial tubes relax and breathing becomes easier.  After 2 to 12 weeks, lungs can hold more air. Exercise becomes easier and circulation improves.  The risk of having a heart attack, stroke, cancer, or lung disease is greatly reduced.  After 1 year, the risk of coronary heart disease is cut in half.  After 5 years, the risk of stroke falls to the same as a nonsmoker.  After 10 years, the risk of lung cancer is cut in half and the risk of other cancers decreases significantly.  After 15 years, the risk of coronary heart disease drops, usually to the level of a nonsmoker.  If you are pregnant, quitting smoking will improve your chances of having a healthy baby.  The people you live with, especially any children, will be healthier.  You will have extra money to spend on things other than cigarettes. QUESTIONS TO THINK ABOUT BEFORE ATTEMPTING TO QUIT You may want to talk about your answers with your  health care provider.  Why do you want to quit?  If you tried to quit in the past, what helped and what did not?  What will be the most difficult situations for you after you quit? How will you plan to handle them?  Who can help you through the tough times? Your family? Friends? A health care provider?  What pleasures do you get from smoking? What ways can you still get pleasure if you quit? Here are some questions to ask your health care provider:  How can you help me to be successful at quitting?  What medicine do you think would be best for me and how should I take it?  What should I do if I need more help?  What is smoking withdrawal like? How can I get information on withdrawal? GET READY  Set a quit date.  Change your environment by getting rid of all cigarettes, ashtrays, matches, and lighters in your home, car, or work. Do not let people smoke in your home.  Review your past attempts to quit. Think about what worked and what did not. GET SUPPORT AND ENCOURAGEMENT You have a better chance of being successful if you have help. You can get support in many ways.  Tell your family, friends, and coworkers that you are going to quit and need their support. Ask them not to smoke around you.  Get individual, group, or telephone counseling and support. Programs are available at local hospitals and health centers. Call   your local health department for information about programs in your area.  Spiritual beliefs and practices may help some smokers quit.  Download a "quit meter" on your computer to keep track of quit statistics, such as how long you have gone without smoking, cigarettes not smoked, and money saved.  Get a self-help book about quitting smoking and staying off tobacco. LEARN NEW SKILLS AND BEHAVIORS  Distract yourself from urges to smoke. Talk to someone, go for a walk, or occupy your time with a task.  Change your normal routine. Take a different route to work.  Drink tea instead of coffee. Eat breakfast in a different place.  Reduce your stress. Take a hot bath, exercise, or read a book.  Plan something enjoyable to do every day. Reward yourself for not smoking.  Explore interactive web-based programs that specialize in helping you quit. GET MEDICINE AND USE IT CORRECTLY Medicines can help you stop smoking and decrease the urge to smoke. Combining medicine with the above behavioral methods and support can greatly increase your chances of successfully quitting smoking.  Nicotine replacement therapy helps deliver nicotine to your body without the negative effects and risks of smoking. Nicotine replacement therapy includes nicotine gum, lozenges, inhalers, nasal sprays, and skin patches. Some may be available over-the-counter and others require a prescription.  Antidepressant medicine helps people abstain from smoking, but how this works is unknown. This medicine is available by prescription.  Nicotinic receptor partial agonist medicine simulates the effect of nicotine in your brain. This medicine is available by prescription. Ask your health care provider for advice about which medicines to use and how to use them based on your health history. Your health care provider will tell you what side effects to look out for if you choose to be on a medicine or therapy. Carefully read the information on the package. Do not use any other product containing nicotine while using a nicotine replacement product.  RELAPSE OR DIFFICULT SITUATIONS Most relapses occur within the first 3 months after quitting. Do not be discouraged if you start smoking again. Remember, most people try several times before finally quitting. You may have symptoms of withdrawal because your body is used to nicotine. You may crave cigarettes, be irritable, feel very hungry, cough often, get headaches, or have difficulty concentrating. The withdrawal symptoms are only temporary. They are strongest  when you first quit, but they will go away within 10-14 days. To reduce the chances of relapse, try to:  Avoid drinking alcohol. Drinking lowers your chances of successfully quitting.  Reduce the amount of caffeine you consume. Once you quit smoking, the amount of caffeine in your body increases and can give you symptoms, such as a rapid heartbeat, sweating, and anxiety.  Avoid smokers because they can make you want to smoke.  Do not let weight gain distract you. Many smokers will gain weight when they quit, usually less than 10 pounds. Eat a healthy diet and stay active. You can always lose the weight gained after you quit.  Find ways to improve your mood other than smoking. FOR MORE INFORMATION  www.smokefree.gov  Document Released: 01/03/2001 Document Revised: 05/26/2013 Document Reviewed: 04/20/2011 [employer] Patient Information 2015 [employer]. This information is not intended to replace advice given to you by your health care provider. Make sure you discuss any questions you have with your health care provider.  

## 2014-03-03 ENCOUNTER — Telehealth: Payer: Self-pay | Admitting: *Deleted

## 2014-03-03 LAB — NMR, LIPOPROFILE
Cholesterol: 116 mg/dL (ref 100–199)
HDL Cholesterol by NMR: 42 mg/dL (ref >39)
HDL Particle Number: 33.8 umol/L (ref >=30.5)
LDL PARTICLE NUMBER: 668 nmol/L (ref <1000)
LDL SIZE: 20.3 nm (ref >20.5)
LDL-C: 49 mg/dL (ref 0–99)
LP-IR Score: 51 — ABNORMAL HIGH (ref <=45)
Small LDL Particle Number: 366 nmol/L (ref <=527)
TRIGLYCERIDES BY NMR: 126 mg/dL (ref 0–149)

## 2014-03-03 LAB — CMP14+EGFR
A/G RATIO: 1.8 (ref 1.1–2.5)
ALBUMIN: 4.4 g/dL (ref 3.5–5.5)
ALT: 21 IU/L (ref 0–32)
AST: 28 IU/L (ref 0–40)
Alkaline Phosphatase: 136 IU/L — ABNORMAL HIGH (ref 39–117)
BILIRUBIN TOTAL: 0.6 mg/dL (ref 0.0–1.2)
BUN / CREAT RATIO: 10 (ref 9–23)
BUN: 7 mg/dL (ref 6–24)
CO2: 22 mmol/L (ref 18–29)
Calcium: 9.9 mg/dL (ref 8.7–10.2)
Chloride: 105 mmol/L (ref 97–108)
Creatinine, Ser: 0.67 mg/dL (ref 0.57–1.00)
GFR calc non Af Amer: 99 mL/min/{1.73_m2} (ref >59)
GFR, EST AFRICAN AMERICAN: 114 mL/min/{1.73_m2} (ref >59)
Globulin, Total: 2.5 g/dL (ref 1.5–4.5)
Glucose: 104 mg/dL — ABNORMAL HIGH (ref 65–99)
Potassium: 4.6 mmol/L (ref 3.5–5.2)
Sodium: 143 mmol/L (ref 134–144)
Total Protein: 6.9 g/dL (ref 6.0–8.5)

## 2014-03-03 LAB — THYROID PANEL WITH TSH
Free Thyroxine Index: 4.9 (ref 1.2–4.9)
T3 Uptake Ratio: 32 % (ref 24–39)
T4 TOTAL: 15.2 ug/dL — AB (ref 4.5–12.0)
TSH: 0.027 u[IU]/mL — ABNORMAL LOW (ref 0.450–4.500)

## 2014-03-03 NOTE — Telephone Encounter (Signed)
lmtcb regarding test results. 

## 2014-03-03 NOTE — Telephone Encounter (Signed)
-----   [facility] FNP sent at 03/03/2014 11:44 AM EST ----- Kidney and liver function stable Cholesterol looks great tsh low- has been on levothyroxine 75mcg daily for awhile- going to wait and recheck in 6 weeks before changing meds Continue current meds- low fat diet and exercise and recheck in 3 months

## 2014-03-04 ENCOUNTER — Telehealth: Payer: Self-pay | Admitting: Nurse Practitioner

## 2014-03-04 NOTE — Telephone Encounter (Signed)
[facility] [place] and [place] said they accidentally gave patient levothyroxine 175mcg. She is supposed to be taking 75mcg and that's what you sent in . Just wanted to touch base with you and see what she should be taking because labs were low. Please advise

## 2014-03-04 NOTE — Telephone Encounter (Signed)
Patient aware, will rck tsh in 6 weeks.

## 2014-03-04 NOTE — Telephone Encounter (Signed)
Stay on th elevothyroxine and we will recheck at next visit- not a big deal.

## 2014-03-05 ENCOUNTER — Encounter: Payer: Self-pay | Admitting: Nurse Practitioner

## 2014-03-05 NOTE — Telephone Encounter (Signed)
Spoke with patient. She is concerned that her heart rate has been rapid. Discussed with Jennifer [name]. Advised patient that she can hold levothyroxine for a few days. Avoid caffeine and strenuous activity. We will repeat thyroid panel at next visit. Follow up as needed. Patient stated understanding and agreement to plan.

## 2014-06-04 ENCOUNTER — Encounter: Payer: Self-pay | Admitting: Nurse Practitioner

## 2014-06-04 ENCOUNTER — Ambulatory Visit (INDEPENDENT_AMBULATORY_CARE_PROVIDER_SITE_OTHER): Payer: Medicaid Other

## 2014-06-04 ENCOUNTER — Encounter (INDEPENDENT_AMBULATORY_CARE_PROVIDER_SITE_OTHER): Payer: Self-pay

## 2014-06-04 ENCOUNTER — Ambulatory Visit (INDEPENDENT_AMBULATORY_CARE_PROVIDER_SITE_OTHER): Payer: Medicaid Other | Admitting: Nurse Practitioner

## 2014-06-04 VITALS — BP 121/70 | HR 92 | Temp 97.1°F | Ht 64.0 in | Wt 128.4 lb

## 2014-06-04 DIAGNOSIS — Z13820 Encounter for screening for osteoporosis: Secondary | ICD-10-CM

## 2014-06-04 DIAGNOSIS — F411 Generalized anxiety disorder: Secondary | ICD-10-CM

## 2014-06-04 DIAGNOSIS — E039 Hypothyroidism, unspecified: Secondary | ICD-10-CM | POA: Diagnosis not present

## 2014-06-04 DIAGNOSIS — J439 Emphysema, unspecified: Secondary | ICD-10-CM | POA: Diagnosis not present

## 2014-06-04 DIAGNOSIS — J411 Mucopurulent chronic bronchitis: Secondary | ICD-10-CM

## 2014-06-04 DIAGNOSIS — E785 Hyperlipidemia, unspecified: Secondary | ICD-10-CM

## 2014-06-04 MED ORDER — BUDESONIDE-FORMOTEROL FUMARATE 80-4.5 MCG/ACT IN AERO: 2.0000 | INHALATION_SPRAY | Freq: Two times a day (BID) | RESPIRATORY_TRACT | Status: DC

## 2014-06-04 MED ORDER — ALBUTEROL SULFATE HFA 108 (90 BASE) MCG/ACT IN AERS: 2.0000 | INHALATION_SPRAY | Freq: Four times a day (QID) | RESPIRATORY_TRACT | Status: DC | PRN

## 2014-06-04 MED ORDER — LEVOTHYROXINE SODIUM 75 MCG PO TABS: 75.0000 ug | ORAL_TABLET | Freq: Every day | ORAL | Status: DC

## 2014-06-04 MED ORDER — ATORVASTATIN CALCIUM 40 MG PO TABS: 40.0000 mg | ORAL_TABLET | Freq: Every day | ORAL | Status: DC

## 2014-06-04 NOTE — Progress Notes (Signed)
Subjective:    Patient ID: Jennifer [name], female    DOB: [birth year], 54 y.o.   MRN: [id number]  Patient in today for follow up of chronic medical problems.   Hyperlipidemia This is a chronic problem. The current episode started more than 1 year ago. The problem is uncontrolled. Recent lipid tests were reviewed and are variable. Pertinent negatives include no chest pain, myalgias or shortness of breath. Current antihyperlipidemic treatment includes statins. The current treatment provides no improvement of lipids. Compliance problems include adherence to diet and adherence to exercise.  Risk factors for coronary artery disease include dyslipidemia, hypertension and post-menopausal.  Anxiety Symptoms include nervous/anxious behavior. Patient reports no chest pain, decreased concentration, dizziness, palpitations or shortness of breath.    Thyroid Problem Presents for follow-up (hypothyroidism) visit. Symptoms include anxiety and hoarse voice. Patient reports no palpitations. The symptoms have been stable. Her past medical history is significant for hyperlipidemia.  COPD/smoker Uses albuterol at least 4x a week- still smoking - patient is going to stop on May 21,2016. She is determined to stop. Coughs up phlegm often  Review of Systems  Constitutional: Negative.   HENT: Positive for hoarse voice.   Respiratory: Positive for wheezing. Negative for shortness of breath.   Cardiovascular: Negative for chest pain and palpitations.  Musculoskeletal: Negative for myalgias.  Neurological: Negative for dizziness.  Psychiatric/Behavioral: Negative for decreased concentration. The patient is nervous/anxious.         Objective:   Physical Exam  Constitutional: She is oriented to person, place, and time. She appears well-developed and well-nourished.  HENT:  Nose: Nose normal.  Mouth/Throat: Oropharynx is clear and moist.  Eyes: EOM are normal.  Neck: Trachea normal, normal range of motion and  full passive range of motion without pain. Neck supple. No JVD present. Carotid bruit is not present. No thyromegaly present.  Cardiovascular: Normal rate, regular rhythm, normal heart sounds and intact distal pulses.  Exam reveals no gallop and no friction rub.   No murmur heard. Pulmonary/Chest: Effort normal. She has wheezes.  Abdominal: Soft. Bowel sounds are normal. She exhibits no distension and no mass. There is no tenderness.  Musculoskeletal: Normal range of motion.  Lymphadenopathy:    She has no cervical adenopathy.  Neurological: She is alert and oriented to person, place, and time. She has normal reflexes.  Skin: Skin is warm and dry.  Psychiatric: She has a normal mood and affect. Her behavior is normal. Judgment and thought content normal.   BP 121/70 mmHg  Pulse 92  Temp(Src) 97.1 F (36.2 C) (Oral)  Ht 5' 4" (1.626 m)  Wt 128 lb 6.4 oz (58.242 kg)  BMI 22.03 kg/m2        Assessment & Plan:    1. Hyperlipidemia with target LDL less than 100 Continue low fat diet - CMP14+EGFR - NMR, lipoprofile - TSH - atorvastatin (LIPITOR) 40 MG tablet; Take 1 tablet (40 mg total) by mouth daily.  Dispense: 30 tablet; Refill: 5  2. Hypothyroidism, unspecified hypothyroidism type - CMP14+EGFR - NMR, lipoprofile - TSH - levothyroxine (SYNTHROID, LEVOTHROID) 75 MCG tablet; Take 1 tablet (75 mcg total) by mouth daily before breakfast.  Dispense: 30 tablet; Refill: 5  3. GAD (generalized anxiety disorder) Stress management  4. Mucopurulent chronic bronchitis STOP smoking - budesonide-formoterol (SYMBICORT) 80-4.5 MCG/ACT inhaler; Inhale 2 puffs into the lungs 2 (two) times daily.  Dispense: 1 Inhaler; Refill: 5 - albuterol (PROVENTIL HFA;VENTOLIN HFA) 108 (90 BASE) MCG/ACT inhaler; Inhale 2 puffs into the  lungs every 6 (six) hours as needed for wheezing.  Dispense: 1 Inhaler; Refill: 0  5. Screening osteoporosis dexascan done today   Labs pending Health maintenance  reviewed- encouraged to do hemoccult cards- will schedule mammo and pap Diet and exercise encouraged Continue all meds Follow up  In 3 months   [place], FNP

## 2014-06-04 NOTE — Patient Instructions (Signed)
Bone Health Our bones do many things. They provide structure, protect organs, anchor muscles, and store calcium. Adequate calcium in your diet and weight-bearing physical activity help build strong bones, improve bone amounts, and may reduce the risk of weakening of bones (osteoporosis) later in life. PEAK BONE MASS By age 56, the average woman has acquired most of her skeletal bone mass. A large decline occurs in older adults which increases the risk of osteoporosis. In women this occurs around the time of menopause. It is important for young girls to reach their peak bone mass in order to maintain bone health throughout life. A person with high bone mass as a young adult will be more likely to have a higher bone mass later in life. Not enough calcium consumption and physical activity early on could result in a failure to achieve optimum bone mass in adulthood. OSTEOPOROSIS Osteoporosis is a disease of the bones. It is defined as low bone mass with deterioration of bone structure. Osteoporosis leads to an increase risk of fractures with falls. These fractures commonly happen in the wrist, hip, and spine. While men and women of all ages and background can develop osteoporosis, some of the risk factors for osteoporosis are:  Female.  White.  Postmenopausal.  Older adults.  Small in body size.  Eating a diet low in calcium.  Physically inactive.  Smoking.  Use of some medications.  Family history. CALCIUM Calcium is a mineral needed by the body for healthy bones, teeth, and proper function of the heart, muscles, and nerves. The body cannot produce calcium so it must be absorbed through food. Good sources of calcium include:  Dairy products (low fat or nonfat milk, cheese, and yogurt).  Dark green leafy vegetables (bok choy and broccoli).  Calcium fortified foods (orange juice, cereal, bread, soy beverages, and tofu products).  Nuts (almonds). Recommended amounts of calcium vary  for individuals. RECOMMENDED CALCIUM INTAKES Age and Amount in mg per day  Children 1 to 3 years / 700 mg  Children 4 to 8 years / 1,000 mg  Children 9 to 13 years / 1,300 mg  Teens 14 to 18 years / 1,300 mg  Adults 19 to 50 years / 1,000 mg  Adult women 51 to 70 years / 1,200 mg  Adults 71 years and older / 1,200 mg  Pregnant and breastfeeding teens / 1,300 mg  Pregnant and breastfeeding adults / 1,000 mg Vitamin D also plays an important role in healthy bone development. Vitamin D helps in the absorption of calcium. WEIGHT-BEARING PHYSICAL ACTIVITY Regular physical activity has many positive health benefits. Benefits include strong bones. Weight-bearing physical activity early in life is important in reaching peak bone mass. Weight-bearing physical activities cause muscles and bones to work against gravity. Some examples of weight bearing physical activities include:  Walking, jogging, or running.  Field Hockey.  Jumping rope.  Dancing.  Soccer.  Tennis or Racquetball.  Stair climbing.  Basketball.  Hiking.  Weight lifting.  Aerobic fitness classes. Including weight-bearing physical activity into an exercise plan is a great way to keep bones healthy. Adults: Engage in at least 30 minutes of moderate physical activity on most, preferably all, days of the week. Children: Engage in at least 60 minutes of moderate physical activity on most, preferably all, days of the week. FOR MORE INFORMATION [place] [employer], Center for Nutrition Policy and Promotion: www.cnpp.usda.gov National Osteoporosis Foundation: www.nof.org Document Released: 04/01/2003 Document Revised: 05/06/2012 Document Reviewed: 07/01/2008 Exit[employer] Patient Information   2015 [employer]. This information is not intended to replace advice given to you by your health care provider. Make sure you discuss any questions you have with your health care provider.  

## 2014-06-05 LAB — NMR, LIPOPROFILE
Cholesterol: 123 mg/dL (ref 100–199)
HDL Cholesterol by NMR: 41 mg/dL (ref >39)
HDL PARTICLE NUMBER: 34.3 umol/L (ref >=30.5)
LDL PARTICLE NUMBER: 909 nmol/L (ref <1000)
LDL Size: 21 nm (ref >20.5)
LDL-C: 57 mg/dL (ref 0–99)
LP-IR Score: 45 (ref <=45)
Small LDL Particle Number: 427 nmol/L (ref <=527)
Triglycerides by NMR: 126 mg/dL (ref 0–149)

## 2014-06-05 LAB — CMP14+EGFR
A/G RATIO: 1.8 (ref 1.1–2.5)
ALT: 11 IU/L (ref 0–32)
AST: 19 IU/L (ref 0–40)
Albumin: 4.6 g/dL (ref 3.5–5.5)
Alkaline Phosphatase: 139 IU/L — ABNORMAL HIGH (ref 39–117)
BILIRUBIN TOTAL: 0.5 mg/dL (ref 0.0–1.2)
BUN / CREAT RATIO: 12 (ref 9–23)
BUN: 10 mg/dL (ref 6–24)
CO2: 23 mmol/L (ref 18–29)
Calcium: 9.7 mg/dL (ref 8.7–10.2)
Chloride: 102 mmol/L (ref 97–108)
Creatinine, Ser: 0.84 mg/dL (ref 0.57–1.00)
GFR calc Af Amer: 90 mL/min/{1.73_m2} (ref >59)
GFR calc non Af Amer: 78 mL/min/{1.73_m2} (ref >59)
GLOBULIN, TOTAL: 2.5 g/dL (ref 1.5–4.5)
GLUCOSE: 97 mg/dL (ref 65–99)
Potassium: 4.7 mmol/L (ref 3.5–5.2)
SODIUM: 143 mmol/L (ref 134–144)
Total Protein: 7.1 g/dL (ref 6.0–8.5)

## 2014-06-05 LAB — TSH: TSH: 2.46 u[IU]/mL (ref 0.450–4.500)

## 2014-06-10 ENCOUNTER — Ambulatory Visit (INDEPENDENT_AMBULATORY_CARE_PROVIDER_SITE_OTHER): Payer: Medicaid Other | Admitting: Pharmacist

## 2014-06-10 ENCOUNTER — Encounter: Payer: Self-pay | Admitting: Pharmacist

## 2014-06-10 VITALS — Ht 63.0 in | Wt 128.5 lb

## 2014-06-10 DIAGNOSIS — F17200 Nicotine dependence, unspecified, uncomplicated: Secondary | ICD-10-CM

## 2014-06-10 DIAGNOSIS — M8580 Other specified disorders of bone density and structure, unspecified site: Secondary | ICD-10-CM | POA: Insufficient documentation

## 2014-06-10 DIAGNOSIS — Z720 Tobacco use: Secondary | ICD-10-CM | POA: Diagnosis not present

## 2014-06-10 NOTE — Patient Instructions (Signed)
Calcium & Vitamin D: The Facts  Why is calcium and vitamin D consumption important? Calcium: . Most Americans do not consume adequate amounts of calcium! Calcium is required for proper muscle function, nerve communication, bone support, and many other functions in the body.  . The body uses bones as a source of calcium. Bones 'remodel' themselves continuously - the body constantly breaks bone down to release calcium and rebuilds bones by replacing calcium in the bone later.  . As we get older, the rate of bone breakdown occurs faster than bone rebuilding which could lead to osteopenia, osteoporosis, and possible fractures.   Vitamin D: . People naturally make vitamin D in the body when sunlight hits the skin and triggers a process that leads to vitamin D production. This natural vitamin D production requires about 10-15 minutes of sun exposure on the hands, arms, and face at least 2-3 times per week. However, due to decreased sun exposure and the use of sunscreen, most people will need to get additional vitamin D from foods or supplements. Your doctor can measure your body's vitamin D level through a simple blood test to determine your daily vitamin D needs.  . Vitamin D is used to help the body absorb calcium, maintain bone health, help the immune system, and reduce inflammation. It also plays a role in muscle performance, balance and risk of falling.  . Vitamin D deficiency can lead to osteomalacia or softening of the bones, bone pain, and muscle weakness.   The recommended daily allowance of Calcium and Vitamin D varies for different age groups. Age group Calcium (mg) Vitamin D (IU)  Females and Males: Age 3-50 1000 mg 600 IU  Females: Age 60- 88 1200 mg 600 IU  Males: Age 8-70 1000 mg 600 IU  Females and Males: Age 40+ 1200 mg 800 IU  Pregnant/lactating Females age 69-50 1000 mg 600 IU   How much Calcium do you get in your diet? Calcium Intake # of servings per day  Total calcium (mg)   Skim milk, 2% milk (1 cup) _________ x 300 mg   Yogurt (1 small container) _________ x 200 mg   Cheese (1oz) _________ x 200 mg   Cottage Cheese (1 cup)             ________ x 150 mg   Almond milk (1 cup) _________ x 450 mg   Fortified Orange Juice (1 cup) _________ x 300 mg   Broccoli or spinach ( 1 cup) _________ x 100 mg   Salmon (3 oz) _________ x 150 mg    Almonds (1/4 cup) _______ x 90 mg      How do we get Calcium and Vitamin D in our diet? Calcium: . Obtaining calcium from the diet is the most preferred way to reach the recommended daily goal. If this goal is not reached through diet, calcium supplements are available.  . Calcium is found in many foods including: dairy products, dark leafy vegetables (like broccoli, kale, and spinach), fish, and fortified products like juices and cereals.  . The food label will have a %DV (percent daily value) listed showing the amount of calcium per serving. To determine the total mg per serving, simply replace the % with zero (0).  For example, Almond Breeze almond milk contains 45% DV of calcium or 464m per 1 cup.  . You can increase the amount of calcium in your diet by using more calcium products in your daily meals. Use yogurt and fruit to  make smoothies or use yogurt to top baked potatoes or make whipped potatoes. Sprinkle low fat cheese onto salads or into egg white omelets. You can even add non-fat dry milk powder (339m calcium per 1/3 cup) to hot cereals, meat loaf, soups, or potatoes.  . Calcium supplements come in many forms including tablets, chewables, and gummies. Be sure to read the label to determine the correct number of tablets per serving and whether or not to take the supplement with food.  . Calcium carbonate products (Oscal, Caltrate, and Viactiv) are generally better absorbed when taken with food while calcium citrate products like Citracal can be taken with or without food.  . The body can only absorb about 600 mg of calcium  at one time. It is recommended to take calcium supplements in small amounts several times per day.  However, taking it all at once is better than not taking it at all. . Increasing your intake of calcium is essential for bone health, but may also lead to some side effects like constipation, increased gas, bloating or abdominal cramping. To help reduce these side effects, start with 1 tablet per day and slowly increase your intake of the supplement to the recommended doses. It is also recommended that you drink plenty of water each day. Vitamin D: . Very few foods naturally contain vitamin D. However, it is found in saltwater fish (like tuna, salmon and mackerel), beef liver, egg yolks, cheese and vitamin D fortified foods (like yogurt, cereals, orange juice and milk) . The amount of vitamin D in each food or product is listed as %DV on the product label. To determine the total amount of vitamin D per serving, drop the % sign and multiply the number by 4. For example, 1 cup of Almond Breeze almond milk contains 25% DV vitamin D or 100 IU per serving (25 x 4 =100). . Vitamin D is also found in multivitamins and supplements and may be listed as ergocalciferol (vitamin D2) or cholecalciferol (vitamin D3). Each of these forms of vitamin D are equivalent and the daily recommended intake will vary based on your age and the vitamin D levels in your body. Follow your doctor's recommendation for vitamin D intake.       Fall Prevention and Home Safety Falls cause injuries and can affect all age groups. It is possible to use preventive measures to significantly decrease the likelihood of falls. There are many simple measures which can make your home safer and prevent falls. OUTDOORS Repair cracks and edges of walkways and driveways. Remove high doorway thresholds. Trim shrubbery on the main path into your home. Have good outside lighting. Clear walkways of tools, rocks, debris, and clutter. Check that handrails  are not broken and are securely fastened. Both sides of steps should have handrails. Have leaves, snow, and ice cleared regularly. Use sand or salt on walkways during winter months. In the garage, clean up grease or oil spills. BATHROOM Install night lights. Install grab bars by the toilet and in the tub and shower. Use non-skid mats or decals in the tub or shower. Place a plastic non-slip stool in the shower to sit on, if needed. Keep floors dry and clean up all water on the floor immediately. Remove soap buildup in the tub or shower on a regular basis. Secure bath mats with non-slip, double-sided rug tape. Remove throw rugs and tripping hazards from the floors. BEDROOMS Install night lights. Make sure a bedside light is easy to reach. Do not  use oversized bedding. Keep a telephone by your bedside. Have a firm chair with side arms to use for getting dressed. Remove throw rugs and tripping hazards from the floor. KITCHEN Keep handles on pots and pans turned toward the center of the stove. Use back burners when possible. Clean up spills quickly and allow time for drying. Avoid walking on wet floors. Avoid hot utensils and knives. Position shelves so they are not too high or low. Place commonly used objects within easy reach. If necessary, use a sturdy step stool with a grab bar when reaching. Keep electrical cables out of the way. Do not use floor polish or wax that makes floors slippery. If you must use wax, use non-skid floor wax. Remove throw rugs and tripping hazards from the floor. STAIRWAYS Never leave objects on stairs. Place handrails on both sides of stairways and use them. Fix any loose handrails. Make sure handrails on both sides of the stairways are as long as the stairs. Check carpeting to make sure it is firmly attached along stairs. Make repairs to worn or loose carpet promptly. Avoid placing throw rugs at the top or bottom of stairways, or properly secure the rug  with carpet tape to prevent slippage. Get rid of throw rugs, if possible. Have an electrician put in a light switch at the top and bottom of the stairs. OTHER FALL PREVENTION TIPS Wear low-heel or rubber-soled shoes that are supportive and fit well. Wear closed toe shoes. When using a stepladder, make sure it is fully opened and both spreaders are firmly locked. Do not climb a closed stepladder. Add color or contrast paint or tape to grab bars and handrails in your home. Place contrasting color strips on first and last steps. Learn and use mobility aids as needed. Install an electrical emergency response system. Turn on lights to avoid dark areas. Replace light bulbs that burn out immediately. Get light switches that glow. Arrange furniture to create clear pathways. Keep furniture in the same place. Firmly attach carpet with non-skid or double-sided tape. Eliminate uneven floor surfaces. Select a carpet pattern that does not visually hide the edge of steps. Be aware of all pets. OTHER HOME SAFETY TIPS Set the water temperature for 120 F (48.8 C). Keep emergency numbers on or near the telephone. Keep smoke detectors on every level of the home and near sleeping areas. Document Released: 12/30/2001 Document Revised: 07/11/2011 Document Reviewed: 03/31/2011 [employer] Patient Information 2015 [employer]. This information is not intended to replace advice given to you by your health care provider. Make sure you discuss any questions you have with your health care provider.                  Exercise for Strong Bones  Exercise is important to build and maintain strong bones / bone density.  There are 2 types of exercises that are important to building and maintaining strong bones:  Weight- bearing and muscle-stregthening.  Weight-bearing Exercises  These exercises include activities that make you move against gravity while staying upright. Weight-bearing exercises can be high-impact or  low-impact.  High-impact weight-bearing exercises help build bones and keep them strong. If you have broken a bone due to osteoporosis or are at risk of breaking a bone, you may need to avoid high-impact exercises. If you're not sure, you should check with your healthcare provider.  Examples of high-impact weight-bearing exercises are: Dancing  Doing high-impact aerobics  Hiking  Jogging/running  Jumping Rope  Stair climbing    Tennis  Low-impact weight-bearing exercises can also help keep bones strong and are a safe alternative if you cannot do high-impact exercises.   Examples of low-impact weight-bearing exercises are: Using elliptical training machines  Doing low-impact aerobics  Using stair-step machines  Fast walking on a treadmill or outside   Muscle-Strengthening Exercises These exercises include activities where you move your body, a weight or some other resistance against gravity. They are also known as resistance exercises and include: Lifting weights  Using elastic exercise bands  Using weight machines  Lifting your own body weight  Functional movements, such as standing and rising up on your toes  Yoga and Pilates can also improve strength, balance and flexibility. However, certain positions may not be safe for people with osteoporosis or those at increased risk of broken bones. For example, exercises that have you bend forward may increase the chance of breaking a bone in the spine.   Non-Impact Exercises There are other types of exercises that can help prevent falls.  Non-impact exercises can help you to improve balance, posture and how well you move in everyday activities. Some of these exercises include: Balance exercises that strengthen your legs and test your balance, such as Tai Chi, can decrease your risk of falls.  Posture exercises that improve your posture and reduce rounded or "sloping" shoulders can help you decrease the chance of breaking a bone, especially  in the spine.  Functional exercises that improve how well you move can help you with everyday activities and decrease your chance of falling and breaking a bone. For example, if you have trouble getting up from a chair or climbing stairs, you should do these activities as exercises.   **A physical therapist can teach you balance, posture and functional exercises. He/she can also help you learn which exercises are safe and appropriate for you.  [facility] has a physical therapy office in [place] in front of our office and referrals can be made for assessments and treatment as needed and strength and balance training.  If you would like to have an assessment with [facility] and our physical therapy team please let a nurse or provider know.

## 2014-06-10 NOTE — Progress Notes (Signed)
Patient ID: Jennifer LeavensLinda [name], female   DOB: Dec 18, 1960, 54 y.o.   MRN: [id number]  [facility] Current Height: Height: 5\' 3"  (160 cm)      Max Lifetime Height:  5\' 4"  Current Weight: Weight: 128 lb 8 oz (58.287 kg)       Ethnicity:Caucasian    HPI: Does pt already have a diagnosis of:  Osteopenia?  Yes Osteoporosis?  No  Back Pain?  No       Kyphosis?  No Prior fracture?  No Med(s) for Osteoporosis/Osteopenia:  none Med(s) previously tried for Osteoporosis/Osteopenia:  none                                                             PMH: Age at menopause:  54 yo Hysterectomy?  No Oophorectomy?  No HRT? No Steroid Use?  Yes - use inhaled corticosteroids daily Thyroid med?  Yes History of cancer?  No History of digestive disorders (ie Crohn's)?  No Current or previous eating disorders?  No Last Vitamin D Result:  78 (06/04/2014) Last GFR Result:  Never checked   FH/SH: Family history of osteoporosis?  No Parent with history of hip fracture?  Yes - mother Family history of breast cancer?  No Exercise?  Yes - walking 20 minutes 3 times per week Smoking?  Yes - trying to quit now Alcohol?  No    Calcium Assessment Calcium Intake  # of servings/day  Calcium mg  Milk (8 oz) 0  x  300  = 0  Yogurt (4 oz) 0 x  200 = 0  Cheese (1 oz) 0 x  200 = 0  Other Calcium sources   250mg   Ca supplement 0 = 0   Estimated calcium intake per day 250mg     DEXA Results Date of Test T-Score for AP Spine L1-L4 T-Score for Total Left Hip T-Score for Total Right Hip  06/04/2014 -2.0 -2.0 -2.2  10/07/2001 -0.8 -1.4 --             FRAX 10 year estimate: Total FX risk:  15%  (consider medication if >/= 20%) Hip FX risk:  2.0% (consider medication if >/= 3%)  Assessment: ostoepenia without high risk of fracture per FRAX estimate.   Tobacco Abuse - patient has quit date of 06/13/14 set and material at home about quitting  Recommendations: 1.   Discussed BMD  / DEXA results and  discussed fracture risk. 2.  recommend calcium 1200mg  daily through supplementation or diet.  3.  recommend weight bearing exercise - 30 minutes at least 4 days per week.   4.  Counseled and educated about fall risk and prevention. 5.  Discussed smoking cessation - patient is motivated to quit due to recent low lung function assessed by spirometry and low BMD.  She already has plan but no support person.  She is advised she can call our office if she is having cravings and I also gave her information about the 24 hour Quit Now number for smoking cessation support.  6. Added serum vitamin D to labs checked last week.   Recheck DEXA:  2 years  Time spent counseling patient:  25 minutes   [name], PharmD, CPP

## 2014-06-11 ENCOUNTER — Telehealth: Payer: Self-pay | Admitting: Nurse Practitioner

## 2014-06-11 LAB — SPECIMEN STATUS REPORT

## 2014-06-11 LAB — VITAMIN D 25 HYDROXY (VIT D DEFICIENCY, FRACTURES): Vit D, 25-Hydroxy: 25.4 ng/mL — ABNORMAL LOW (ref 30.0–100.0)

## 2014-06-11 NOTE — Telephone Encounter (Signed)
Please review and advise.

## 2014-06-11 NOTE — Telephone Encounter (Signed)
symbicort can cause thrush - need sto rinse mouth out after use. Nystatin rx sent to pharmacy

## 2014-06-11 NOTE — Telephone Encounter (Signed)
Pt notified of RX and recommendation Verbalizes understanding 

## 2014-06-12 ENCOUNTER — Other Ambulatory Visit: Payer: Self-pay | Admitting: *Deleted

## 2014-06-12 ENCOUNTER — Other Ambulatory Visit: Payer: Self-pay | Admitting: Nurse Practitioner

## 2014-06-12 MED ORDER — NYSTATIN 100000 UNIT/ML MT SUSP: 5.0000 mL | Freq: Four times a day (QID) | OROMUCOSAL | Status: DC

## 2014-06-12 MED ORDER — MAGIC MOUTHWASH: 5.0000 mL | Freq: Three times a day (TID) | ORAL | Status: DC

## 2014-09-09 ENCOUNTER — Encounter: Payer: Self-pay | Admitting: Nurse Practitioner

## 2014-09-09 ENCOUNTER — Ambulatory Visit (INDEPENDENT_AMBULATORY_CARE_PROVIDER_SITE_OTHER): Payer: Medicaid Other | Admitting: Nurse Practitioner

## 2014-09-09 VITALS — BP 109/64 | HR 89 | Temp 96.9°F | Ht 63.0 in | Wt 130.0 lb

## 2014-09-09 DIAGNOSIS — M8580 Other specified disorders of bone density and structure, unspecified site: Secondary | ICD-10-CM | POA: Diagnosis not present

## 2014-09-09 DIAGNOSIS — E785 Hyperlipidemia, unspecified: Secondary | ICD-10-CM

## 2014-09-09 DIAGNOSIS — J411 Mucopurulent chronic bronchitis: Secondary | ICD-10-CM

## 2014-09-09 DIAGNOSIS — F411 Generalized anxiety disorder: Secondary | ICD-10-CM

## 2014-09-09 DIAGNOSIS — Z1212 Encounter for screening for malignant neoplasm of rectum: Secondary | ICD-10-CM

## 2014-09-09 DIAGNOSIS — E039 Hypothyroidism, unspecified: Secondary | ICD-10-CM

## 2014-09-09 MED ORDER — ATORVASTATIN CALCIUM 40 MG PO TABS: 40.0000 mg | ORAL_TABLET | Freq: Every day | ORAL | Status: DC

## 2014-09-09 MED ORDER — LEVOTHYROXINE SODIUM 75 MCG PO TABS: 75.0000 ug | ORAL_TABLET | Freq: Every day | ORAL | Status: DC

## 2014-09-09 MED ORDER — BUDESONIDE-FORMOTEROL FUMARATE 80-4.5 MCG/ACT IN AERO: 2.0000 | INHALATION_SPRAY | Freq: Two times a day (BID) | RESPIRATORY_TRACT | Status: DC

## 2014-09-09 NOTE — Progress Notes (Signed)
Subjective:    Patient ID: Jennifer [name], female    DOB: [birth year], 54 y.o.   MRN: [id number]  Patient in today for follow up of chronic medical problems.   Hyperlipidemia This is a chronic problem. The current episode started more than 1 year ago. The problem is uncontrolled. Recent lipid tests were reviewed and are variable. Pertinent negatives include no chest pain, myalgias or shortness of breath. Current antihyperlipidemic treatment includes statins. The current treatment provides no improvement of lipids. Compliance problems include adherence to diet and adherence to exercise.  Risk factors for coronary artery disease include dyslipidemia, hypertension and post-menopausal.  Anxiety Symptoms include nervous/anxious behavior. Patient reports no chest pain, decreased concentration, dizziness, palpitations or shortness of breath.    Thyroid Problem Presents for follow-up (hypothyroidism) visit. Symptoms include anxiety and hoarse voice. Patient reports no palpitations. The symptoms have been stable. Her past medical history is significant for hyperlipidemia.  COPD/smoker Uses albuterol at least 4x a week- still smoking - patient is going to stop on May 21,2016. She is determined to stop. Coughs up phlegm often  Review of Systems  Constitutional: Negative.   HENT: Positive for hoarse voice.   Respiratory: Positive for wheezing. Negative for shortness of breath.   Cardiovascular: Negative for chest pain and palpitations.  Musculoskeletal: Negative for myalgias.  Neurological: Negative for dizziness.  Psychiatric/Behavioral: Negative for decreased concentration. The patient is nervous/anxious.         Objective:   Physical Exam  Constitutional: She is oriented to person, place, and time. She appears well-developed and well-nourished.  HENT:  Nose: Nose normal.  Mouth/Throat: Oropharynx is clear and moist.  Eyes: EOM are normal.  Neck: Trachea normal, normal range of motion and  full passive range of motion without pain. Neck supple. No JVD present. Carotid bruit is not present. No thyromegaly present.  Cardiovascular: Normal rate, regular rhythm, normal heart sounds and intact distal pulses.  Exam reveals no gallop and no friction rub.   No murmur heard. Pulmonary/Chest: Effort normal. She has wheezes.  Abdominal: Soft. Bowel sounds are normal. She exhibits no distension and no mass. There is no tenderness.  Musculoskeletal: Normal range of motion.  Lymphadenopathy:    She has no cervical adenopathy.  Neurological: She is alert and oriented to person, place, and time. She has normal reflexes.  Skin: Skin is warm and dry.  Psychiatric: She has a normal mood and affect. Her behavior is normal. Judgment and thought content normal.   BP 109/64 mmHg  Pulse 89  Temp(Src) 96.9 F (36.1 C) (Oral)  Ht $R'5\' 3"'LA$  (1.6 m)  Wt 130 lb (58.968 kg)  BMI 23.03 kg/m2        Assessment & Plan:   1. Hypothyroidism, unspecified hypothyroidism type - Thyroid Panel With TSH - levothyroxine (SYNTHROID, LEVOTHROID) 75 MCG tablet; Take 1 tablet (75 mcg total) by mouth daily before breakfast.  Dispense: 30 tablet; Refill: 5  2. Mucopurulent chronic bronchitis Continue to avoid cigarettes - budesonide-formoterol (SYMBICORT) 80-4.5 MCG/ACT inhaler; Inhale 2 puffs into the lungs 2 (two) times daily.  Dispense: 1 Inhaler; Refill: 5  3. Osteopenia Multivitamin daily with calcium and vitamin d Weight bearing exercises  4. Hyperlipidemia with target LDL less than 100 Low fat diet - CMP14+EGFR - Lipid panel - atorvastatin (LIPITOR) 40 MG tablet; Take 1 tablet (40 mg total) by mouth daily.  Dispense: 30 tablet; Refill: 5  5. GAD (generalized anxiety disorder) Stress management  6. Screening for malignant neoplasm of the  rectum - Fecal occult blood, imunochemical; Future    Labs pending Health maintenance reviewed Diet and exercise encouraged Continue all meds Follow up  In  3 month   [place], FNP

## 2014-09-09 NOTE — Patient Instructions (Signed)
Bone Health Our bones do many things. They provide structure, protect organs, anchor muscles, and store calcium. Adequate calcium in your diet and weight-bearing physical activity help build strong bones, improve bone amounts, and may reduce the risk of weakening of bones (osteoporosis) later in life. PEAK BONE MASS By age 56, the average woman has acquired most of her skeletal bone mass. A large decline occurs in older adults which increases the risk of osteoporosis. In women this occurs around the time of menopause. It is important for young girls to reach their peak bone mass in order to maintain bone health throughout life. A person with high bone mass as a young adult will be more likely to have a higher bone mass later in life. Not enough calcium consumption and physical activity early on could result in a failure to achieve optimum bone mass in adulthood. OSTEOPOROSIS Osteoporosis is a disease of the bones. It is defined as low bone mass with deterioration of bone structure. Osteoporosis leads to an increase risk of fractures with falls. These fractures commonly happen in the wrist, hip, and spine. While men and women of all ages and background can develop osteoporosis, some of the risk factors for osteoporosis are:  Female.  White.  Postmenopausal.  Older adults.  Small in body size.  Eating a diet low in calcium.  Physically inactive.  Smoking.  Use of some medications.  Family history. CALCIUM Calcium is a mineral needed by the body for healthy bones, teeth, and proper function of the heart, muscles, and nerves. The body cannot produce calcium so it must be absorbed through food. Good sources of calcium include:  Dairy products (low fat or nonfat milk, cheese, and yogurt).  Dark green leafy vegetables (bok choy and broccoli).  Calcium fortified foods (orange juice, cereal, bread, soy beverages, and tofu products).  Nuts (almonds). Recommended amounts of calcium vary  for individuals. RECOMMENDED CALCIUM INTAKES Age and Amount in mg per day  Children 1 to 3 years / 700 mg  Children 4 to 8 years / 1,000 mg  Children 9 to 13 years / 1,300 mg  Teens 14 to 18 years / 1,300 mg  Adults 19 to 50 years / 1,000 mg  Adult women 51 to 70 years / 1,200 mg  Adults 71 years and older / 1,200 mg  Pregnant and breastfeeding teens / 1,300 mg  Pregnant and breastfeeding adults / 1,000 mg Vitamin D also plays an important role in healthy bone development. Vitamin D helps in the absorption of calcium. WEIGHT-BEARING PHYSICAL ACTIVITY Regular physical activity has many positive health benefits. Benefits include strong bones. Weight-bearing physical activity early in life is important in reaching peak bone mass. Weight-bearing physical activities cause muscles and bones to work against gravity. Some examples of weight bearing physical activities include:  Walking, jogging, or running.  Field Hockey.  Jumping rope.  Dancing.  Soccer.  Tennis or Racquetball.  Stair climbing.  Basketball.  Hiking.  Weight lifting.  Aerobic fitness classes. Including weight-bearing physical activity into an exercise plan is a great way to keep bones healthy. Adults: Engage in at least 30 minutes of moderate physical activity on most, preferably all, days of the week. Children: Engage in at least 60 minutes of moderate physical activity on most, preferably all, days of the week. FOR MORE INFORMATION [place] [employer], Center for Nutrition Policy and Promotion: www.cnpp.usda.gov National Osteoporosis Foundation: www.nof.org Document Released: 04/01/2003 Document Revised: 05/06/2012 Document Reviewed: 07/01/2008 Exit[employer] Patient Information   2015 [employer]. This information is not intended to replace advice given to you by your health care provider. Make sure you discuss any questions you have with your health care provider.  

## 2014-09-10 LAB — THYROID PANEL WITH TSH
FREE THYROXINE INDEX: 2.7 (ref 1.2–4.9)
T3 UPTAKE RATIO: 29 % (ref 24–39)
T4, Total: 9.3 ug/dL (ref 4.5–12.0)
TSH: 1.09 u[IU]/mL (ref 0.450–4.500)

## 2014-09-10 LAB — CMP14+EGFR
A/G RATIO: 2.2 (ref 1.1–2.5)
ALK PHOS: 124 IU/L — AB (ref 39–117)
ALT: 11 IU/L (ref 0–32)
AST: 18 IU/L (ref 0–40)
Albumin: 4.6 g/dL (ref 3.5–5.5)
BILIRUBIN TOTAL: 0.4 mg/dL (ref 0.0–1.2)
BUN / CREAT RATIO: 11 (ref 9–23)
BUN: 9 mg/dL (ref 6–24)
CHLORIDE: 103 mmol/L (ref 97–108)
CO2: 25 mmol/L (ref 18–29)
Calcium: 9.7 mg/dL (ref 8.7–10.2)
Creatinine, Ser: 0.85 mg/dL (ref 0.57–1.00)
GFR calc Af Amer: 89 mL/min/{1.73_m2} (ref >59)
GFR calc non Af Amer: 77 mL/min/{1.73_m2} (ref >59)
GLUCOSE: 93 mg/dL (ref 65–99)
Globulin, Total: 2.1 g/dL (ref 1.5–4.5)
Potassium: 4.7 mmol/L (ref 3.5–5.2)
Sodium: 142 mmol/L (ref 134–144)
Total Protein: 6.7 g/dL (ref 6.0–8.5)

## 2014-09-10 LAB — LIPID PANEL
CHOL/HDL RATIO: 3.4 ratio (ref 0.0–4.4)
Cholesterol, Total: 127 mg/dL (ref 100–199)
HDL: 37 mg/dL — ABNORMAL LOW (ref >39)
LDL CALC: 61 mg/dL (ref 0–99)
TRIGLYCERIDES: 145 mg/dL (ref 0–149)
VLDL Cholesterol Cal: 29 mg/dL (ref 5–40)

## 2014-12-18 ENCOUNTER — Ambulatory Visit: Payer: Medicaid Other | Admitting: Nurse Practitioner

## 2015-01-01 ENCOUNTER — Ambulatory Visit (INDEPENDENT_AMBULATORY_CARE_PROVIDER_SITE_OTHER): Payer: Medicaid Other | Admitting: Nurse Practitioner

## 2015-01-01 ENCOUNTER — Encounter: Payer: Self-pay | Admitting: Nurse Practitioner

## 2015-01-01 VITALS — BP 113/72 | HR 82 | Temp 96.8°F | Ht 63.0 in | Wt 129.0 lb

## 2015-01-01 DIAGNOSIS — E785 Hyperlipidemia, unspecified: Secondary | ICD-10-CM | POA: Diagnosis not present

## 2015-01-01 DIAGNOSIS — E039 Hypothyroidism, unspecified: Secondary | ICD-10-CM

## 2015-01-01 DIAGNOSIS — M8580 Other specified disorders of bone density and structure, unspecified site: Secondary | ICD-10-CM

## 2015-01-01 DIAGNOSIS — Z1212 Encounter for screening for malignant neoplasm of rectum: Secondary | ICD-10-CM | POA: Diagnosis not present

## 2015-01-01 DIAGNOSIS — J411 Mucopurulent chronic bronchitis: Secondary | ICD-10-CM | POA: Diagnosis not present

## 2015-01-01 DIAGNOSIS — F411 Generalized anxiety disorder: Secondary | ICD-10-CM | POA: Diagnosis not present

## 2015-01-01 DIAGNOSIS — Z1159 Encounter for screening for other viral diseases: Secondary | ICD-10-CM

## 2015-01-01 MED ORDER — BUDESONIDE-FORMOTEROL FUMARATE 80-4.5 MCG/ACT IN AERO: 2.0000 | INHALATION_SPRAY | Freq: Two times a day (BID) | RESPIRATORY_TRACT | Status: DC

## 2015-01-01 MED ORDER — ATORVASTATIN CALCIUM 40 MG PO TABS: 40.0000 mg | ORAL_TABLET | Freq: Every day | ORAL | Status: DC

## 2015-01-01 MED ORDER — LEVOTHYROXINE SODIUM 75 MCG PO TABS: 75.0000 ug | ORAL_TABLET | Freq: Every day | ORAL | Status: DC

## 2015-01-01 NOTE — Progress Notes (Signed)
Subjective:    Patient ID: Jennifer [name], female    DOB: 09/09/1960, 54 y.o.   MRN: [id number]  Patient in today for follow up of chronic medical problems.   Hyperlipidemia This is a chronic problem. The current episode started more than 1 year ago. The problem is uncontrolled. Recent lipid tests were reviewed and are variable. Pertinent negatives include no chest pain, myalgias or shortness of breath. Current antihyperlipidemic treatment includes statins. The current treatment provides no improvement of lipids. Compliance problems include adherence to diet and adherence to exercise.  Risk factors for coronary artery disease include dyslipidemia, hypertension and post-menopausal.  Anxiety Visit type: Sees [facility]. Symptoms include nervous/anxious behavior. Patient reports no chest pain, decreased concentration, dizziness, palpitations or shortness of breath.    Thyroid Problem Presents for follow-up (hypothyroidism) visit. Symptoms include anxiety and hoarse voice. Patient reports no palpitations. The symptoms have been stable. Her past medical history is significant for hyperlipidemia.  COPD/smoker Uses albuterol at least 4x a week- Patient stopped smoking in May- doing well- husband smokes so it make sit very difficult but she is doing well.    Review of Systems  Constitutional: Negative.   HENT: Positive for hoarse voice.   Respiratory: Positive for wheezing. Negative for shortness of breath.   Cardiovascular: Negative for chest pain and palpitations.  Musculoskeletal: Negative for myalgias.  Neurological: Negative for dizziness.  Psychiatric/Behavioral: Negative for decreased concentration. The patient is nervous/anxious.         Objective:   Physical Exam  Constitutional: She is oriented to person, place, and time. She appears well-developed and well-nourished.  HENT:  Nose: Nose normal.  Mouth/Throat: Oropharynx is clear and moist.  Eyes: EOM are normal.  Neck: Trachea  normal, normal range of motion and full passive range of motion without pain. Neck supple. No JVD present. Carotid bruit is not present. No thyromegaly present.  Cardiovascular: Normal rate, regular rhythm, normal heart sounds and intact distal pulses.  Exam reveals no gallop and no friction rub.   No murmur heard. Pulmonary/Chest: Effort normal. She has wheezes.  Abdominal: Soft. Bowel sounds are normal. She exhibits no distension and no mass. There is no tenderness.  Musculoskeletal: Normal range of motion.  Lymphadenopathy:    She has no cervical adenopathy.  Neurological: She is alert and oriented to person, place, and time. She has normal reflexes.  Skin: Skin is warm and dry.  Psychiatric: She has a normal mood and affect. Her behavior is normal. Judgment and thought content normal.   BP 113/72 mmHg  Pulse 82  Temp(Src) 96.8 F (36 C) (Oral)  Ht $R'5\' 3"'jL$  (1.6 m)  Wt 129 lb (58.514 kg)  BMI 22.86 kg/m2        Assessment & Plan:  1. Mucopurulent chronic bronchitis (HCC) Continue  To avoid cigarettes - budesonide-formoterol (SYMBICORT) 80-4.5 MCG/ACT inhaler; Inhale 2 puffs into the lungs 2 (two) times daily.  Dispense: 1 Inhaler; Refill: 5  2. Hypothyroidism, unspecified hypothyroidism type - levothyroxine (SYNTHROID, LEVOTHROID) 75 MCG tablet; Take 1 tablet (75 mcg total) by mouth daily before breakfast.  Dispense: 30 tablet; Refill: 5  3. Osteopenia Weight bearing exercises  4. Hyperlipidemia with target LDL less than 100 Low fat diet - CMP14+EGFR - Lipid panel - atorvastatin (LIPITOR) 40 MG tablet; Take 1 tablet (40 mg total) by mouth daily.  Dispense: 30 tablet; Refill: 5  5. GAD (generalized anxiety disorder) Stress management Keep follow up appointments with [facility]  6. Screening for malignant neoplasm  of the rectum - Fecal occult blood, imunochemical; Future  7. Need for hepatitis C screening test - Hepatitis C antibody   Patient to make appointment for  mammogram Labs pending Health maintenance reviewed Diet and exercise encouraged Continue all meds Follow up  In 6 month   [place], FNP

## 2015-01-01 NOTE — Patient Instructions (Signed)
Bone Health Bones protect organs, store calcium, and anchor muscles. Good health habits, such as eating nutritious foods and exercising regularly, are important for maintaining healthy bones. They can also help to prevent a condition that causes bones to lose density and become weak and brittle (osteoporosis). WHY IS BONE MASS IMPORTANT? Bone mass refers to the amount of bone tissue that you have. The higher your bone mass, the stronger your bones. An important step toward having healthy bones throughout life is to have strong and dense bones during childhood. A young adult who has a high bone mass is more likely to have a high bone mass later in life. Bone mass at its greatest it is called peak bone mass. A large decline in bone mass occurs in older adults. In women, it occurs about the time of menopause. During this time, it is important to practice good health habits, because if more bone is lost than what is replaced, the bones will become less healthy and more likely to break (fracture). If you find that you have a low bone mass, you may be able to prevent osteoporosis or further bone loss by changing your diet and lifestyle. HOW CAN I FIND OUT IF MY BONE MASS IS LOW? Bone mass can be measured with an X-ray test that is called a bone mineral density (BMD) test. This test is recommended for all women who are age 65 or older. It may also be recommended for men who are age 70 or older, or for people who are more likely to develop osteoporosis due to:  Having bones that break easily.  Having a long-term disease that weakens bones, such as kidney disease or rheumatoid arthritis.  Having menopause earlier than normal.  Taking medicine that weakens bones, such as steroids, thyroid hormones, or hormone treatment for breast cancer or prostate cancer.  Smoking.  Drinking three or more alcoholic drinks each day. WHAT ARE THE NUTRITIONAL RECOMMENDATIONS FOR HEALTHY BONES? To have healthy bones, you need  to get enough of the right minerals and vitamins. Most nutrition experts recommend getting these nutrients from the foods that you eat. Nutritional recommendations vary from person to person. Ask your health care provider what is healthy for you. Here are some general guidelines. Calcium Recommendations Calcium is the most important (essential) mineral for bone health. Most people can get enough calcium from their diet, but supplements may be recommended for people who are at risk for osteoporosis. Good sources of calcium include:  Dairy products, such as low-fat or nonfat milk, cheese, and yogurt.  Dark green leafy vegetables, such as bok choy and broccoli.  Calcium-fortified foods, such as orange juice, cereal, bread, soy beverages, and tofu products.  Nuts, such as almonds. Follow these recommended amounts for daily calcium intake:  Children, age 1-3: 700 mg.  Children, age 4-8: 1,000 mg.  Children, age 9-13: 1,300 mg.  Teens, age 14-18: 1,300 mg.  Adults, age 19-50: 1,000 mg.  Adults, age 51-70:  Men: 1,000 mg.  Women: 1,200 mg.  Adults, age 71 or older: 1,200 mg.  Pregnant and breastfeeding females:  Teens: 1,300 mg.  Adults: 1,000 mg. Vitamin D Recommendations Vitamin D is the most essential vitamin for bone health. It helps the body to absorb calcium. Sunlight stimulates the skin to make vitamin D, so be sure to get enough sunlight. If you live in a cold climate or you do not get outside often, your health care provider may recommend that you take vitamin D supplements. Good   sources of vitamin D in your diet include:  Egg yolks.  Saltwater fish.  Milk and cereal fortified with vitamin D. Follow these recommended amounts for daily vitamin D intake:  Children and teens, age 1-18: 600 international units.  Adults, age 50 or younger: 400-800 international units.  Adults, age 51 or older: 800-1,000 international units. Other Nutrients Other nutrients for bone  health include:  Phosphorus. This mineral is found in meat, poultry, dairy foods, nuts, and legumes. The recommended daily intake for adult men and adult women is 700 mg.  Magnesium. This mineral is found in seeds, nuts, dark green vegetables, and legumes. The recommended daily intake for adult men is 400-420 mg. For adult women, it is 310-320 mg.  Vitamin K. This vitamin is found in green leafy vegetables. The recommended daily intake is 120 mg for adult men and 90 mg for adult women. WHAT TYPE OF PHYSICAL ACTIVITY IS BEST FOR BUILDING AND MAINTAINING HEALTHY BONES? Weight-bearing and strength-building activities are important for building and maintaining peak bone mass. Weight-bearing activities cause muscles and bones to work against gravity. Strength-building activities increases muscle strength that supports bones. Weight-bearing and muscle-building activities include:  Walking and hiking.  Jogging and running.  Dancing.  Gym exercises.  Lifting weights.  Tennis and racquetball.  Climbing stairs.  Aerobics. Adults should get at least 30 minutes of moderate physical activity on most days. Children should get at least 60 minutes of moderate physical activity on most days. Ask your health care provide what type of exercise is best for you. WHERE CAN I FIND MORE INFORMATION? For more information, check out the following websites:  National Osteoporosis Foundation: http://nof.org/learn/basics  National Institutes of Health: http://www.niams.nih.gov/Health_Info/Bone/Bone_Health/bone_health_for_life.asp   This information is not intended to replace advice given to you by your health care provider. Make sure you discuss any questions you have with your health care provider.   Document Released: 04/01/2003 Document Revised: 05/26/2014 Document Reviewed: 01/14/2014 Elsevier Interactive Patient Education 2016 [employer] Inc.  

## 2015-01-02 LAB — CMP14+EGFR
ALT: 12 IU/L (ref 0–32)
AST: 17 IU/L (ref 0–40)
Albumin/Globulin Ratio: 2.2 (ref 1.1–2.5)
Albumin: 4.4 g/dL (ref 3.5–5.5)
Alkaline Phosphatase: 131 IU/L — ABNORMAL HIGH (ref 39–117)
BUN/Creatinine Ratio: 9 (ref 9–23)
BUN: 6 mg/dL (ref 6–24)
Bilirubin Total: 0.5 mg/dL (ref 0.0–1.2)
CO2: 26 mmol/L (ref 18–29)
Calcium: 9.7 mg/dL (ref 8.7–10.2)
Chloride: 104 mmol/L (ref 97–106)
Creatinine, Ser: 0.68 mg/dL (ref 0.57–1.00)
GFR calc Af Amer: 113 mL/min/1.73
GFR calc non Af Amer: 98 mL/min/1.73
Globulin, Total: 2 g/dL (ref 1.5–4.5)
Glucose: 95 mg/dL (ref 65–99)
Potassium: 4.9 mmol/L (ref 3.5–5.2)
Sodium: 142 mmol/L (ref 136–144)
Total Protein: 6.4 g/dL (ref 6.0–8.5)

## 2015-01-02 LAB — LIPID PANEL
Chol/HDL Ratio: 3 ratio (ref 0.0–4.4)
Cholesterol, Total: 109 mg/dL (ref 100–199)
HDL: 36 mg/dL — ABNORMAL LOW
LDL Calculated: 54 mg/dL (ref 0–99)
Triglycerides: 95 mg/dL (ref 0–149)
VLDL Cholesterol Cal: 19 mg/dL (ref 5–40)

## 2015-01-02 LAB — HEPATITIS C ANTIBODY: Hep C Virus Ab: <0.1 {s_co_ratio} (ref 0.0–0.9)

## 2015-01-14 ENCOUNTER — Encounter: Payer: Self-pay | Admitting: *Deleted

## 2015-03-08 ENCOUNTER — Telehealth: Payer: Self-pay | Admitting: Nurse Practitioner

## 2015-03-08 NOTE — Telephone Encounter (Signed)
Pt has apt scheduled

## 2015-03-23 ENCOUNTER — Encounter: Payer: Medicaid Other | Admitting: *Deleted

## 2015-03-23 LAB — HM MAMMOGRAPHY

## 2015-03-26 ENCOUNTER — Encounter: Payer: Self-pay | Admitting: *Deleted

## 2015-07-02 ENCOUNTER — Encounter: Payer: Self-pay | Admitting: Nurse Practitioner

## 2015-07-02 ENCOUNTER — Ambulatory Visit (INDEPENDENT_AMBULATORY_CARE_PROVIDER_SITE_OTHER): Payer: Medicaid Other | Admitting: Nurse Practitioner

## 2015-07-02 VITALS — BP 127/70 | HR 83 | Temp 97.3°F | Ht 63.0 in | Wt 128.0 lb

## 2015-07-02 DIAGNOSIS — M8580 Other specified disorders of bone density and structure, unspecified site: Secondary | ICD-10-CM | POA: Diagnosis not present

## 2015-07-02 DIAGNOSIS — J411 Mucopurulent chronic bronchitis: Secondary | ICD-10-CM | POA: Diagnosis not present

## 2015-07-02 DIAGNOSIS — E785 Hyperlipidemia, unspecified: Secondary | ICD-10-CM

## 2015-07-02 DIAGNOSIS — F411 Generalized anxiety disorder: Secondary | ICD-10-CM

## 2015-07-02 DIAGNOSIS — E039 Hypothyroidism, unspecified: Secondary | ICD-10-CM

## 2015-07-02 MED ORDER — LEVOTHYROXINE SODIUM 75 MCG PO TABS: 75.0000 ug | ORAL_TABLET | Freq: Every day | ORAL | Status: DC

## 2015-07-02 MED ORDER — BUDESONIDE-FORMOTEROL FUMARATE 80-4.5 MCG/ACT IN AERO
2.0000 | INHALATION_SPRAY | Freq: Two times a day (BID) | RESPIRATORY_TRACT | Status: DC
Start: 2015-07-02 — End: 2016-01-03

## 2015-07-02 MED ORDER — ALPRAZOLAM 0.5 MG PO TABS: 0.5000 mg | ORAL_TABLET | Freq: Three times a day (TID) | ORAL | Status: DC | PRN

## 2015-07-02 MED ORDER — ATORVASTATIN CALCIUM 40 MG PO TABS: 40.0000 mg | ORAL_TABLET | Freq: Every day | ORAL | Status: DC

## 2015-07-02 NOTE — Progress Notes (Signed)
Subjective:    Patient ID: Jennifer [name], female    DOB: [birth year], 57 y.o.   MRN: [id number]  Patient here today for follow up of chronic medical problems.  Outpatient Encounter Prescriptions as of 07/02/2015  Medication Sig  . albuterol (PROVENTIL HFA;VENTOLIN HFA) 108 (90 BASE) MCG/ACT inhaler Inhale 2 puffs into the lungs every 6 (six) hours as needed for wheezing.  Marland Kitchen ALPRAZolam (XANAX) 0.5 MG tablet Take 0.5 mg by mouth 3 (three) times daily as needed for sleep.  Marland Kitchen atorvastatin (LIPITOR) 40 MG tablet Take 1 tablet (40 mg total) by mouth daily.  . budesonide-formoterol (SYMBICORT) 80-4.5 MCG/ACT inhaler Inhale 2 puffs into the lungs 2 (two) times daily.  Marland Kitchen levothyroxine (SYNTHROID, LEVOTHROID) 75 MCG tablet Take 1 tablet (75 mcg total) by mouth daily before breakfast.   No facility-administered encounter medications on file as of 07/02/2015.    Hyperlipidemia This is a chronic problem. The current episode started more than 1 year ago. The problem is uncontrolled. Recent lipid tests were reviewed and are variable. Pertinent negatives include no myalgias. Current antihyperlipidemic treatment includes statins. The current treatment provides no improvement of lipids. Compliance problems include adherence to diet and adherence to exercise.  Risk factors for coronary artery disease include dyslipidemia, hypertension and post-menopausal.  Thyroid Problem Presents for follow-up (hypothyroidism) visit. Symptoms include hoarse voice. The symptoms have been stable. Her past medical history is significant for hyperlipidemia.  COPD/smoker Patient stopped smoking in March 2017. Cough has gradually gotten better- has n ot needed albuterol in over 2 weeks. Still wants to smoke but is fighting urge. GAD Currently on xanax 0.'5mg'$  TID- keeps her calm- she says she never misses taking her meds. osteopenia Currently not on any meds- had dexa scan in may of 2016.- no c/o back pain    Review of Systems   Constitutional: Negative.   HENT: Positive for hoarse voice.   Respiratory: Negative.   Cardiovascular: Negative.   Genitourinary: Negative.   Musculoskeletal: Negative for myalgias.  Neurological: Negative.   Psychiatric/Behavioral: Negative.   All other systems reviewed and are negative.       Objective:   Physical Exam  Constitutional: She is oriented to person, place, and time. She appears well-developed and well-nourished.  HENT:  Nose: Nose normal.  Mouth/Throat: Oropharynx is clear and moist.  Eyes: EOM are normal.  Neck: Trachea normal, normal range of motion and full passive range of motion without pain. Neck supple. No JVD present. Carotid bruit is not present. No thyromegaly present.  Cardiovascular: Normal rate, regular rhythm, normal heart sounds and intact distal pulses.  Exam reveals no gallop and no friction rub.   No murmur heard. Pulmonary/Chest: Effort normal and breath sounds normal.  Abdominal: Soft. Bowel sounds are normal. She exhibits no distension and no mass. There is no tenderness.  Musculoskeletal: Normal range of motion.  Lymphadenopathy:    She has no cervical adenopathy.  Neurological: She is alert and oriented to person, place, and time. She has normal reflexes.  Skin: Skin is warm and dry.  Psychiatric: She has a normal mood and affect. Her behavior is normal. Judgment and thought content normal.    BP 127/70 mmHg  Pulse 83  Temp(Src) 97.3 F (36.3 C) (Oral)  Ht '5\' 3"'$  (1.6 m)  Wt 128 lb (58.06 kg)  BMI 22.68 kg/m2        Assessment & Plan:  1. Mucopurulent chronic bronchitis ([place]) Encouraged to not pick up cigarettes again - budesonide-formoterol (SYMBICORT) 80-4.5  MCG/ACT inhaler; Inhale 2 puffs into the lungs 2 (two) times daily.  Dispense: 1 Inhaler; Refill: 5  2. Hypothyroidism, unspecified hypothyroidism type - levothyroxine (SYNTHROID, LEVOTHROID) 75 MCG tablet; Take 1 tablet (75 mcg total) by mouth daily before breakfast.   Dispense: 30 tablet; Refill: 5 - Thyroid Panel With TSH  3. Hyperlipidemia with target LDL less than 100 Low fat diet - atorvastatin (LIPITOR) 40 MG tablet; Take 1 tablet (40 mg total) by mouth daily.  Dispense: 30 tablet; Refill: 5 - CMP14+EGFR - Lipid panel  4. GAD (generalized anxiety disorder) Stress management - ALPRAZolam (XANAX) 0.5 MG tablet; Take 1 tablet (0.5 mg total) by mouth 3 (three) times daily as needed for sleep.  Dispense: 90 tablet; Refill: 1  5. Osteopenia Weight bearing exercsies    Labs pending Health maintenance reviewed Diet and exercise encouraged Continue all meds Follow up  In 6 months   [place], FNP

## 2015-07-02 NOTE — Patient Instructions (Signed)
Bone Health Bones protect organs, store calcium, and anchor muscles. Good health habits, such as eating nutritious foods and exercising regularly, are important for maintaining healthy bones. They can also help to prevent a condition that causes bones to lose density and become weak and brittle (osteoporosis). WHY IS BONE MASS IMPORTANT? Bone mass refers to the amount of bone tissue that you have. The higher your bone mass, the stronger your bones. An important step toward having healthy bones throughout life is to have strong and dense bones during childhood. A young adult who has a high bone mass is more likely to have a high bone mass later in life. Bone mass at its greatest it is called peak bone mass. A large decline in bone mass occurs in older adults. In women, it occurs about the time of menopause. During this time, it is important to practice good health habits, because if more bone is lost than what is replaced, the bones will become less healthy and more likely to break (fracture). If you find that you have a low bone mass, you may be able to prevent osteoporosis or further bone loss by changing your diet and lifestyle. HOW CAN I FIND OUT IF MY BONE MASS IS LOW? Bone mass can be measured with an X-ray test that is called a bone mineral density (BMD) test. This test is recommended for all women who are age 65 or older. It may also be recommended for men who are age 70 or older, or for people who are more likely to develop osteoporosis due to:  Having bones that break easily.  Having a long-term disease that weakens bones, such as kidney disease or rheumatoid arthritis.  Having menopause earlier than normal.  Taking medicine that weakens bones, such as steroids, thyroid hormones, or hormone treatment for breast cancer or prostate cancer.  Smoking.  Drinking three or more alcoholic drinks each day. WHAT ARE THE NUTRITIONAL RECOMMENDATIONS FOR HEALTHY BONES? To have healthy bones, you need  to get enough of the right minerals and vitamins. Most nutrition experts recommend getting these nutrients from the foods that you eat. Nutritional recommendations vary from person to person. Ask your health care provider what is healthy for you. Here are some general guidelines. Calcium Recommendations Calcium is the most important (essential) mineral for bone health. Most people can get enough calcium from their diet, but supplements may be recommended for people who are at risk for osteoporosis. Good sources of calcium include:  Dairy products, such as low-fat or nonfat milk, cheese, and yogurt.  Dark green leafy vegetables, such as bok choy and broccoli.  Calcium-fortified foods, such as orange juice, cereal, bread, soy beverages, and tofu products.  Nuts, such as almonds. Follow these recommended amounts for daily calcium intake:  Children, age 1-3: 700 mg.  Children, age 4-8: 1,000 mg.  Children, age 9-13: 1,300 mg.  Teens, age 14-18: 1,300 mg.  Adults, age 19-50: 1,000 mg.  Adults, age 51-70:  Men: 1,000 mg.  Women: 1,200 mg.  Adults, age 71 or older: 1,200 mg.  Pregnant and breastfeeding females:  Teens: 1,300 mg.  Adults: 1,000 mg. Vitamin D Recommendations Vitamin D is the most essential vitamin for bone health. It helps the body to absorb calcium. Sunlight stimulates the skin to make vitamin D, so be sure to get enough sunlight. If you live in a cold climate or you do not get outside often, your health care provider may recommend that you take vitamin D supplements. Good   sources of vitamin D in your diet include:  Egg yolks.  Saltwater fish.  Milk and cereal fortified with vitamin D. Follow these recommended amounts for daily vitamin D intake:  Children and teens, age 1-18: 600 international units.  Adults, age 50 or younger: 400-800 international units.  Adults, age 51 or older: 800-1,000 international units. Other Nutrients Other nutrients for bone  health include:  Phosphorus. This mineral is found in meat, poultry, dairy foods, nuts, and legumes. The recommended daily intake for adult men and adult women is 700 mg.  Magnesium. This mineral is found in seeds, nuts, dark green vegetables, and legumes. The recommended daily intake for adult men is 400-420 mg. For adult women, it is 310-320 mg.  Vitamin K. This vitamin is found in green leafy vegetables. The recommended daily intake is 120 mg for adult men and 90 mg for adult women. WHAT TYPE OF PHYSICAL ACTIVITY IS BEST FOR BUILDING AND MAINTAINING HEALTHY BONES? Weight-bearing and strength-building activities are important for building and maintaining peak bone mass. Weight-bearing activities cause muscles and bones to work against gravity. Strength-building activities increases muscle strength that supports bones. Weight-bearing and muscle-building activities include:  Walking and hiking.  Jogging and running.  Dancing.  Gym exercises.  Lifting weights.  Tennis and racquetball.  Climbing stairs.  Aerobics. Adults should get at least 30 minutes of moderate physical activity on most days. Children should get at least 60 minutes of moderate physical activity on most days. Ask your health care provide what type of exercise is best for you. WHERE CAN I FIND MORE INFORMATION? For more information, check out the following websites:  National Osteoporosis Foundation: http://nof.org/learn/basics  National Institutes of Health: http://www.niams.nih.gov/Health_Info/Bone/Bone_Health/bone_health_for_life.asp   This information is not intended to replace advice given to you by your health care provider. Make sure you discuss any questions you have with your health care provider.   Document Released: 04/01/2003 Document Revised: 05/26/2014 Document Reviewed: 01/14/2014 Elsevier Interactive Patient Education 2016 [employer] Inc.  

## 2015-07-03 LAB — LIPID PANEL
CHOL/HDL RATIO: 2.7 ratio (ref 0.0–4.4)
CHOLESTEROL TOTAL: 112 mg/dL (ref 100–199)
HDL: 41 mg/dL (ref >39)
LDL Calculated: 53 mg/dL (ref 0–99)
TRIGLYCERIDES: 90 mg/dL (ref 0–149)
VLDL Cholesterol Cal: 18 mg/dL (ref 5–40)

## 2015-07-03 LAB — CMP14+EGFR
A/G RATIO: 2.1 (ref 1.2–2.2)
ALK PHOS: 145 IU/L — AB (ref 39–117)
ALT: 14 IU/L (ref 0–32)
AST: 23 IU/L (ref 0–40)
Albumin: 4.4 g/dL (ref 3.5–5.5)
BUN/Creatinine Ratio: 7 — ABNORMAL LOW (ref 9–23)
BUN: 5 mg/dL — ABNORMAL LOW (ref 6–24)
Bilirubin Total: 0.5 mg/dL (ref 0.0–1.2)
CALCIUM: 9.2 mg/dL (ref 8.7–10.2)
CHLORIDE: 104 mmol/L (ref 96–106)
CO2: 25 mmol/L (ref 18–29)
Creatinine, Ser: 0.72 mg/dL (ref 0.57–1.00)
GFR calc Af Amer: 108 mL/min/{1.73_m2} (ref >59)
GFR, EST NON AFRICAN AMERICAN: 93 mL/min/{1.73_m2} (ref >59)
Globulin, Total: 2.1 g/dL (ref 1.5–4.5)
Glucose: 86 mg/dL (ref 65–99)
POTASSIUM: 3.9 mmol/L (ref 3.5–5.2)
Sodium: 144 mmol/L (ref 134–144)
Total Protein: 6.5 g/dL (ref 6.0–8.5)

## 2015-07-03 LAB — THYROID PANEL WITH TSH
FREE THYROXINE INDEX: 2.7 (ref 1.2–4.9)
T3 Uptake Ratio: 31 % (ref 24–39)
T4, Total: 8.8 ug/dL (ref 4.5–12.0)
TSH: 2.2 u[IU]/mL (ref 0.450–4.500)

## 2016-01-03 ENCOUNTER — Ambulatory Visit (INDEPENDENT_AMBULATORY_CARE_PROVIDER_SITE_OTHER): Payer: Medicaid Other | Admitting: Nurse Practitioner

## 2016-01-03 ENCOUNTER — Encounter: Payer: Self-pay | Admitting: Nurse Practitioner

## 2016-01-03 VITALS — BP 114/81 | HR 92 | Temp 96.7°F | Ht 63.0 in | Wt 129.0 lb

## 2016-01-03 DIAGNOSIS — E785 Hyperlipidemia, unspecified: Secondary | ICD-10-CM

## 2016-01-03 DIAGNOSIS — Z1211 Encounter for screening for malignant neoplasm of colon: Secondary | ICD-10-CM

## 2016-01-03 DIAGNOSIS — F411 Generalized anxiety disorder: Secondary | ICD-10-CM

## 2016-01-03 DIAGNOSIS — E039 Hypothyroidism, unspecified: Secondary | ICD-10-CM

## 2016-01-03 DIAGNOSIS — Z1212 Encounter for screening for malignant neoplasm of rectum: Secondary | ICD-10-CM | POA: Diagnosis not present

## 2016-01-03 DIAGNOSIS — M8580 Other specified disorders of bone density and structure, unspecified site: Secondary | ICD-10-CM | POA: Diagnosis not present

## 2016-01-03 DIAGNOSIS — M25511 Pain in right shoulder: Secondary | ICD-10-CM | POA: Diagnosis not present

## 2016-01-03 MED ORDER — ATORVASTATIN CALCIUM 40 MG PO TABS: 40.0000 mg | ORAL_TABLET | Freq: Every day | ORAL | 5 refills | Status: DC

## 2016-01-03 MED ORDER — LEVOTHYROXINE SODIUM 75 MCG PO TABS: 75.0000 ug | ORAL_TABLET | Freq: Every day | ORAL | 5 refills | Status: DC

## 2016-01-03 MED ORDER — NAPROXEN 500 MG PO TABS: 500.0000 mg | ORAL_TABLET | Freq: Two times a day (BID) | ORAL | 1 refills | Status: DC

## 2016-01-03 NOTE — Progress Notes (Signed)
Subjective:    Patient ID: Jennifer [name], female    DOB: 08-16-1960, 55 y.o.   MRN: [id number]  Patient here today for follow up of chronic medical problems.  Outpatient Encounter Prescriptions as of 01/03/2016  Medication Sig  . albuterol (PROVENTIL HFA;VENTOLIN HFA) 108 (90 BASE) MCG/ACT inhaler Inhale 2 puffs into the lungs every 6 (six) hours as needed for wheezing.  Marland Kitchen ALPRAZolam (XANAX) 0.5 MG tablet Take 1 tablet (0.5 mg total) by mouth 3 (three) times daily as needed for sleep.  Marland Kitchen atorvastatin (LIPITOR) 40 MG tablet Take 1 tablet (40 mg total) by mouth daily.  . budesonide-formoterol (SYMBICORT) 80-4.5 MCG/ACT inhaler Inhale 2 puffs into the lungs 2 (two) times daily.  Marland Kitchen levothyroxine (SYNTHROID, LEVOTHROID) 75 MCG tablet Take 1 tablet (75 mcg total) by mouth daily before breakfast.   No facility-administered encounter medications on file as of 01/03/2016.    C/O right arm pain that radiate sup into shoulder and across back. Started about 2 weeks ago. Says it is a dull constant ache. Hurts to raise arm up.She has taken tylenol which helps some.   Hyperlipidemia  This is a chronic problem. The current episode started more than 1 year ago. The problem is uncontrolled. Recent lipid tests were reviewed and are variable. Pertinent negatives include no chest pain, myalgias or shortness of breath. Current antihyperlipidemic treatment includes statins. The current treatment provides no improvement of lipids. Compliance problems include adherence to diet and adherence to exercise.  Risk factors for coronary artery disease include dyslipidemia, hypertension and post-menopausal.  Anxiety  Symptoms include nervous/anxious behavior. Patient reports no chest pain, decreased concentration, dizziness, palpitations or shortness of breath.    Thyroid Problem  Presents for follow-up (hypothyroidism) visit. Symptoms include anxiety and hoarse voice. Patient reports no palpitations. The symptoms have  been stable. Her past medical history is significant for hyperlipidemia.  COPD/smoker Uses albuterol at least 4x a week- has not smoked at all in over 4 months.  Review of Systems  Constitutional: Negative.   HENT: Positive for hoarse voice.   Respiratory: Positive for wheezing. Negative for shortness of breath.   Cardiovascular: Negative for chest pain and palpitations.  Musculoskeletal: Negative for myalgias.  Neurological: Negative for dizziness.  Psychiatric/Behavioral: Negative for decreased concentration. The patient is nervous/anxious.         Objective:   Physical Exam  Constitutional: She is oriented to person, place, and time. She appears well-developed and well-nourished.  HENT:  Nose: Nose normal.  Mouth/Throat: Oropharynx is clear and moist.  Eyes: EOM are normal.  Neck: Trachea normal, normal range of motion and full passive range of motion without pain. Neck supple. No JVD present. Carotid bruit is not present. No thyromegaly present.  Cardiovascular: Normal rate, regular rhythm, normal heart sounds and intact distal pulses.  Exam reveals no gallop and no friction rub.   No murmur heard. Pulmonary/Chest: Effort normal. She has wheezes.  Abdominal: Soft. Bowel sounds are normal. She exhibits no distension and no mass. There is no tenderness.  Musculoskeletal: Normal range of motion.  Right shoulder pain with extension and internal rotation - pain at abduction. No point tenderness. Grips equal bil   Lymphadenopathy:    She has no cervical adenopathy.  Neurological: She is alert and oriented to person, place, and time. She has normal reflexes.  Skin: Skin is warm and dry.  Psychiatric: She has a normal mood and affect. Her behavior is normal. Judgment and thought content normal.   BP 114/81  Pulse 92   Temp (!) 96.7 F (35.9 C) (Oral)   Ht '5\' 3"'$  (1.6 m)   Wt 129 lb (58.5 kg)   BMI 22.85 kg/m       Assessment & Plan:   1. Encounter for colorectal cancer  screening - Fecal occult blood, imunochemical; Future  2. Hypothyroidism, unspecified type - Thyroid Panel With TSH - levothyroxine (SYNTHROID, LEVOTHROID) 75 MCG tablet; Take 1 tablet (75 mcg total) by mouth daily before breakfast.  Dispense: 30 tablet; Refill: 5  3. Osteopenia, unspecified location Weight bearing exercises  4. GAD (generalized anxiety disorder) Stress management  5. Hyperlipidemia with target LDL less than 100 Low fat diet - CMP14+EGFR - Lipid panel - atorvastatin (LIPITOR) 40 MG tablet; Take 1 tablet (40 mg total) by mouth daily.  Dispense: 30 tablet; Refill: 5  6. Acute pain of right shoulder Rest Moist heat - naproxen (NAPROSYN) 500 MG tablet; Take 1 tablet (500 mg total) by mouth 2 (two) times daily with a meal.  Dispense: 60 tablet; Refill: 1    Labs pending Health maintenance reviewed Diet and exercise encouraged Continue all meds Follow up  In 6 months   [place], FNP

## 2016-01-03 NOTE — Patient Instructions (Signed)
Adhesive Capsulitis Introduction Adhesive capsulitis is inflammation of the tendons and ligaments that surround the shoulder joint (shoulder capsule). This condition causes the shoulder to become stiff and painful to move. Adhesive capsulitis is also called frozen shoulder. What are the causes? This condition may be caused by:  An injury to the shoulder joint.  Straining the shoulder.  Not moving the shoulder for a period of time. This can happen if your arm was injured or in a sling.  Long-standing health problems, such as:  Diabetes.  Thyroid problems.  Heart disease.  Stroke.  Rheumatoid arthritis.  Lung disease. In some cases, the cause may not be known. What increases the risk? This condition is more likely to develop in:  Women.  People who are older than 57 years of age. What are the signs or symptoms? Symptoms of this condition include:  Pain in the shoulder when moving the arm. There may also be pain when parts of the shoulder are touched. The pain is worse at night or when at rest.  Soreness or aching in the shoulder.  Inability to move the shoulder normally.  Muscle spasms. How is this diagnosed? This condition is diagnosed with a physical exam and imaging tests, such as an X-ray or MRI. How is this treated? This condition may be treated with:  Treatment of the underlying cause or condition.  Physical therapy. This involves performing exercises to get the shoulder moving again.  Medicine. Medicine may be given to relieve pain, inflammation, or muscle spasms.  Steroid injections into the shoulder joint.  Shoulder manipulation. This is a procedure to move the shoulder into another position. It is done after you are given a medicine to make you fall asleep (general anesthetic). The joint may also be injected with salt water at high pressure to break down scarring.  Surgery. This may be done in severe cases when other treatments have failed. Although  most people recover completely from adhesive capsulitis, some may not regain the full movement of the shoulder. Follow these instructions at home:  Take over-the-counter and prescription medicines only as told by your health care provider.  If you are being treated with physical therapy, follow instructions from your physical therapist.  Avoid exercises that put a lot of demand on your shoulder, such as throwing. These exercises can make pain worse.  If directed, apply ice to the injured area:  Put ice in a plastic bag.  Place a towel between your skin and the bag.  Leave the ice on for 20 minutes, 2-3 times per day. Contact a health care provider if:  You develop new symptoms.  Your symptoms get worse. This information is not intended to replace advice given to you by your health care provider. Make sure you discuss any questions you have with your health care provider. Document Released: 11/06/2008 Document Revised: 06/17/2015 Document Reviewed: 05/04/2014  2017 Elsevier  

## 2016-01-04 LAB — CMP14+EGFR
ALK PHOS: 136 IU/L — AB (ref 39–117)
ALT: 13 IU/L (ref 0–32)
AST: 20 IU/L (ref 0–40)
Albumin/Globulin Ratio: 1.8 (ref 1.2–2.2)
Albumin: 4.4 g/dL (ref 3.5–5.5)
BUN/Creatinine Ratio: 9 (ref 9–23)
BUN: 7 mg/dL (ref 6–24)
Bilirubin Total: 0.5 mg/dL (ref 0.0–1.2)
CHLORIDE: 103 mmol/L (ref 96–106)
CO2: 24 mmol/L (ref 18–29)
CREATININE: 0.76 mg/dL (ref 0.57–1.00)
Calcium: 9.8 mg/dL (ref 8.7–10.2)
GFR calc Af Amer: 101 mL/min/{1.73_m2} (ref >59)
GFR calc non Af Amer: 87 mL/min/{1.73_m2} (ref >59)
GLUCOSE: 94 mg/dL (ref 65–99)
Globulin, Total: 2.5 g/dL (ref 1.5–4.5)
Potassium: 4.6 mmol/L (ref 3.5–5.2)
SODIUM: 143 mmol/L (ref 134–144)
Total Protein: 6.9 g/dL (ref 6.0–8.5)

## 2016-01-04 LAB — THYROID PANEL WITH TSH
FREE THYROXINE INDEX: 2.1 (ref 1.2–4.9)
T3 UPTAKE RATIO: 28 % (ref 24–39)
T4, Total: 7.6 ug/dL (ref 4.5–12.0)
TSH: 1.93 u[IU]/mL (ref 0.450–4.500)

## 2016-01-04 LAB — LIPID PANEL
CHOLESTEROL TOTAL: 127 mg/dL (ref 100–199)
Chol/HDL Ratio: 3 ratio units (ref 0.0–4.4)
HDL: 43 mg/dL (ref >39)
LDL Calculated: 58 mg/dL (ref 0–99)
TRIGLYCERIDES: 130 mg/dL (ref 0–149)
VLDL Cholesterol Cal: 26 mg/dL (ref 5–40)

## 2016-01-06 ENCOUNTER — Telehealth: Payer: Self-pay

## 2016-01-12 ENCOUNTER — Ambulatory Visit (INDEPENDENT_AMBULATORY_CARE_PROVIDER_SITE_OTHER): Payer: Medicaid Other | Admitting: Family Medicine

## 2016-01-12 ENCOUNTER — Encounter: Payer: Self-pay | Admitting: Family Medicine

## 2016-01-12 VITALS — BP 132/81 | HR 84 | Temp 97.4°F | Ht 63.0 in | Wt 130.2 lb

## 2016-01-12 DIAGNOSIS — S46911D Strain of unspecified muscle, fascia and tendon at shoulder and upper arm level, right arm, subsequent encounter: Secondary | ICD-10-CM

## 2016-01-12 DIAGNOSIS — S46911S Strain of unspecified muscle, fascia and tendon at shoulder and upper arm level, right arm, sequela: Secondary | ICD-10-CM

## 2016-01-12 MED ORDER — PREDNISONE 20 MG PO TABS: ORAL_TABLET | ORAL | 0 refills | Status: DC

## 2016-01-12 MED ORDER — TIZANIDINE HCL 2 MG PO CAPS: 2.0000 mg | ORAL_CAPSULE | Freq: Three times a day (TID) | ORAL | 1 refills | Status: DC

## 2016-01-12 NOTE — Progress Notes (Signed)
BP 132/81   Pulse 84   Temp 97.4 F (36.3 C) (Oral)   Ht 5\' 3"  (1.6 m)   Wt 130 lb 4 oz (59.1 kg)   BMI 23.07 kg/m    Subjective:    Patient ID: Jennifer LeavensLinda [name], female    DOB: 19-Feb-1960, 55 y.o.   MRN: [id number]  HPI: Jennifer [name] is a 55 y.o. female presenting on 01/12/2016 for Pain in right arm, elbow, shoulder and neck (saw [facility] on 01/03/16 and prescribed Naproxen. symptoms have not improved)   HPI Pain in right arm, continued Patient comes in today because she has had continued pain in her right arm and shoulder. She was seen on 01/03/2016 for this and given naproxen. She feels like the naproxen helped some but not completely. She still having a lot of pain and stiffness in that arm starting from her scapula going down into her forearm. She denies any weakness or numbness. This was triggered or brought on by her grabbing something and pulling it tightly which jerked that arm.  Relevant past medical, surgical, family and social history reviewed and updated as indicated. Interim medical history since our last visit reviewed. Allergies and medications reviewed and updated.  Review of Systems  Constitutional: Negative for chills and fever.  Respiratory: Negative for chest tightness and shortness of breath.   Cardiovascular: Negative for chest pain and leg swelling.  Genitourinary: Negative for difficulty urinating and dysuria.  Musculoskeletal: Positive for arthralgias and myalgias. Negative for back pain, gait problem, joint swelling, neck pain and neck stiffness.  Skin: Negative for color change and rash.  Neurological: Negative for light-headedness and headaches.  Psychiatric/Behavioral: Negative for agitation and behavioral problems.  All other systems reviewed and are negative.   Per HPI unless specifically indicated above      Objective:    BP 132/81   Pulse 84   Temp 97.4 F (36.3 C) (Oral)   Ht 5\' 3"  (1.6 m)   Wt 130 lb 4 oz (59.1 kg)   BMI 23.07 kg/m   Wt  Readings from Last 3 Encounters:  01/12/16 130 lb 4 oz (59.1 kg)  01/03/16 129 lb (58.5 kg)  07/02/15 128 lb (58.1 kg)    Physical Exam  Constitutional: She is oriented to person, place, and time. She appears well-developed and well-nourished. No distress.  Eyes: Conjunctivae are normal.  Cardiovascular: Normal rate, regular rhythm, normal heart sounds and intact distal pulses.   No murmur heard. Pulmonary/Chest: Effort normal and breath sounds normal. No respiratory distress. She has no wheezes.  Musculoskeletal: Normal range of motion. She exhibits no edema.       Right shoulder: She exhibits tenderness (Tenderness over infraspinatus and lateral shoulder. All rotator cuff testing was normal). She exhibits normal range of motion, no bony tenderness, no swelling, no crepitus, no deformity, normal pulse and normal strength.  Neurological: She is alert and oriented to person, place, and time. Coordination normal.  Skin: Skin is warm and dry. No rash noted. She is not diaphoretic.  Psychiatric: She has a normal mood and affect. Her behavior is normal.  Nursing note and vitals reviewed.      Assessment & Plan:   Problem List Items Addressed This Visit    None    Visit Diagnoses    Right shoulder strain, sequela    -  Primary   Relevant Medications   tizanidine (ZANAFLEX) 2 MG capsule   predniSONE (DELTASONE) 20 MG tablet  Follow up plan: Return if symptoms worsen or fail to improve.  Counseling provided for all of the vaccine components No orders of the defined types were placed in this encounter.   Arville CareJoshua Dettinger, MD [facility] 01/12/2016, 3:31 PM

## 2016-01-12 NOTE — Telephone Encounter (Signed)
x

## 2016-01-28 ENCOUNTER — Encounter: Payer: Self-pay | Admitting: Nurse Practitioner

## 2016-01-28 ENCOUNTER — Ambulatory Visit (INDEPENDENT_AMBULATORY_CARE_PROVIDER_SITE_OTHER): Payer: Medicaid Other | Admitting: Nurse Practitioner

## 2016-01-28 VITALS — BP 135/73 | HR 90 | Temp 96.7°F | Ht 63.0 in | Wt 128.0 lb

## 2016-01-28 DIAGNOSIS — M542 Cervicalgia: Secondary | ICD-10-CM

## 2016-01-28 DIAGNOSIS — S46911S Strain of unspecified muscle, fascia and tendon at shoulder and upper arm level, right arm, sequela: Secondary | ICD-10-CM | POA: Diagnosis not present

## 2016-01-28 NOTE — Patient Instructions (Signed)
Muscle Pain, Adult Muscle pain (myalgia) may be mild or severe. In most cases, the pain lasts only a short time and it goes away without treatment. It is normal to feel some muscle pain after starting a workout program. Muscles that have not been used often will be sore at first. Muscle pain may also be caused by many other things, including:  Overuse or muscle strain, especially if you are not in shape. This is the most common cause of muscle pain.  Injury.  Bruises.  Viruses, such as the flu.  Infectious diseases.  A chronic condition that causes muscle tenderness, fatigue, and headache (fibromyalgia).  A condition, such as lupus, in which the body's disease-fighting system attacks other organs in the body (autoimmune or rheumatologic diseases).  Certain drugs, including ACE inhibitors and statins. To diagnose the cause of your muscle pain, your health care provider will do a physical exam and ask questions about the pain and when it began. If you have not had muscle pain for very long, your health care provider may want to wait before doing much testing. If your muscle pain has lasted a long time, your health care provider may want to run tests right away. In some cases, this may include tests to rule out certain conditions or illnesses. Treatment for muscle pain depends on the cause. Home care is often enough to relieve muscle pain. Your health care provider may also prescribe anti-inflammatory medicine. Follow these instructions at home: Activity   If overuse is causing your muscle pain:  Slow down your activities until the pain goes away.  Do regular, gentle exercises if you are not usually active.  Warm up before exercising. Stretch before and after exercising. This can help lower the risk of muscle pain.  Do not continue working out if the pain is very bad. Bad pain could mean that you have injured a muscle. Managing pain and discomfort    If directed, apply ice to the  sore muscle:  Put ice in a plastic bag.  Place a towel between your skin and the bag.  Leave the ice on for 20 minutes, 2-3 times a day.  You may also alternate between applying ice and applying heat as told by your health care provider. To apply heat, use the heat source that your health care provider recommends, such as a moist heat pack or a heating pad.  Place a towel between your skin and the heat source.  Leave the heat on for 20-30 minutes.  Remove the heat if your skin turns bright red. This is especially important if you are unable to feel pain, heat, or cold. You may have a greater risk of getting burned. Medicines   Take over-the-counter and prescription medicines only as told by your health care provider.  Do not drive or use heavy machinery while taking prescription pain medicine. Contact a health care provider if:  Your muscle pain gets worse and medicines do not help.  You have muscle pain that lasts longer than 3 days.  You have a rash or fever along with muscle pain.  You have muscle pain after a tick bite.  You have muscle pain while working out, even though you are in good physical condition.  You have redness, soreness, or swelling along with muscle pain.  You have muscle pain after starting a new medicine or changing the dose of a medicine. Get help right away if:  You have trouble breathing.  You have trouble swallowing.    You have muscle pain along with a stiff neck, fever, and vomiting.  You have severe muscle weakness or cannot move part of your body. This information is not intended to replace advice given to you by your health care provider. Make sure you discuss any questions you have with your health care provider. Document Released: 12/01/2005 Document Revised: 07/30/2015 Document Reviewed: 06/01/2015 Elsevier Interactive Patient Education  2017 Elsevier Inc.  

## 2016-01-28 NOTE — Progress Notes (Signed)
   Subjective:    Patient ID: Orlan LeavensLinda Zubiate, female    DOB: 26-Aug-1960, 56 y.o.   MRN: [id number]  HPI  Patient comes in today c/o neck and shoulder pain on right. She saw Dr. Louanne Skyeettinger on 01/12/16 with same complaint- she was given zanaflex and prednisone. No improvement so she went to [facility] in [place]. They took xray which she was told had cervical radiculopathy and need ed to see neurosurgeon. Pain is affecting her sleep. She started taking naproxen this morning which really helped.  Review of Systems  Constitutional: Negative.   HENT: Negative.   Respiratory: Negative.   Cardiovascular: Negative.   Gastrointestinal: Negative.   Genitourinary: Negative.   Musculoskeletal: Positive for neck pain.  Neurological: Negative.   Psychiatric/Behavioral: Negative.   All other systems reviewed and are negative.      Objective:   Physical Exam  Constitutional: She is oriented to person, place, and time. She appears well-developed and well-nourished. No distress.  Cardiovascular: Normal rate, regular rhythm and normal heart sounds.   Pulmonary/Chest: Effort normal and breath sounds normal.  Musculoskeletal:  Decrease ROM of cervical spine with pain  On flexion and extension FROM of right shoulder without pain Grips equal bil Motor strength and sensation of upper ext intact  Neurological: She is alert and oriented to person, place, and time.  Skin: Skin is warm.  Psychiatric: She has a normal mood and affect. Her behavior is normal. Judgment and thought content normal.   BP 135/73   Pulse 90   Temp (!) 96.7 F (35.9 C) (Oral)   Ht 5\' 3"  (1.6 m)   Wt 128 lb (58.1 kg)   BMI 22.67 kg/m         Assessment & Plan:   1. Right shoulder strain, sequela   2. Neck pain    Continue naproxen- can take tylenol in mid day betwenn naproxen doses Moist heat No heavy lifting Orders Placed This Encounter  Procedures  . MR Cervical Spine Wo Contrast    Standing Status:   Future   Standing Expiration Date:   03/27/2017    Order Specific Question:   Reason for Exam (SYMPTOM  OR DIAGNOSIS REQUIRED)    Answer:   cervicla pain    Order Specific Question:   Preferred imaging location?    Answer:   [facility] (table limit-350lbs)    Order Specific Question:   What is the patient's sedation requirement?    Answer:   No Sedation    Order Specific Question:   Does the patient have a pacemaker or implanted devices?    Answer:   No   Mary-Margaret Daphine DeutscherMartin, FNP'

## 2016-02-02 ENCOUNTER — Telehealth: Payer: Self-pay | Admitting: Nurse Practitioner

## 2016-02-02 NOTE — Telephone Encounter (Signed)
In process 

## 2016-02-03 ENCOUNTER — Telehealth: Payer: Self-pay

## 2016-02-03 ENCOUNTER — Telehealth: Payer: Self-pay | Admitting: Nurse Practitioner

## 2016-02-03 ENCOUNTER — Other Ambulatory Visit: Payer: Self-pay | Admitting: Nurse Practitioner

## 2016-02-03 DIAGNOSIS — M25511 Pain in right shoulder: Secondary | ICD-10-CM

## 2016-02-03 DIAGNOSIS — M542 Cervicalgia: Secondary | ICD-10-CM

## 2016-02-03 NOTE — Telephone Encounter (Signed)
Pt aware it is in process.

## 2016-02-03 NOTE — Telephone Encounter (Signed)
Still in review with Medicaid   I talked with her daughter

## 2016-02-04 NOTE — Telephone Encounter (Signed)
Left message, no approval for MRI back yet from medicaid.  If pain worse should go to  ED for help.

## 2016-02-07 ENCOUNTER — Telehealth: Payer: Self-pay | Admitting: Nurse Practitioner

## 2016-02-07 NOTE — Telephone Encounter (Signed)
Call to pt  appt scheduled with ortho on 02/14/2015 She has continued R arm and neck pain Fingertips are now numb and tingling Please advise

## 2016-02-07 NOTE — Telephone Encounter (Signed)
Not much can do till sees ortho

## 2016-02-07 NOTE — Telephone Encounter (Signed)
Pt aware of appointment date/time with [facility]  02/14/2016 at 10:45 Arrive at 10:15 for registration  Pt informed I would mail letter also, and include a map

## 2016-02-07 NOTE — Telephone Encounter (Signed)
Please advise on how referral is going.

## 2016-02-07 NOTE — Telephone Encounter (Signed)
Pt notified to keep appt with ortho

## 2016-02-14 DIAGNOSIS — M79601 Pain in right arm: Secondary | ICD-10-CM | POA: Diagnosis not present

## 2016-02-14 DIAGNOSIS — M542 Cervicalgia: Secondary | ICD-10-CM | POA: Diagnosis not present

## 2016-03-02 ENCOUNTER — Other Ambulatory Visit: Payer: Self-pay | Admitting: Nurse Practitioner

## 2016-03-02 DIAGNOSIS — M25511 Pain in right shoulder: Secondary | ICD-10-CM

## 2016-07-17 ENCOUNTER — Other Ambulatory Visit: Payer: Self-pay

## 2016-07-17 ENCOUNTER — Ambulatory Visit (INDEPENDENT_AMBULATORY_CARE_PROVIDER_SITE_OTHER): Payer: Medicaid Other | Admitting: Nurse Practitioner

## 2016-07-17 ENCOUNTER — Encounter: Payer: Self-pay | Admitting: Nurse Practitioner

## 2016-07-17 VITALS — BP 131/79 | HR 84 | Temp 97.1°F | Ht 63.0 in | Wt 131.0 lb

## 2016-07-17 DIAGNOSIS — E785 Hyperlipidemia, unspecified: Secondary | ICD-10-CM | POA: Diagnosis not present

## 2016-07-17 DIAGNOSIS — M5412 Radiculopathy, cervical region: Secondary | ICD-10-CM

## 2016-07-17 DIAGNOSIS — M8580 Other specified disorders of bone density and structure, unspecified site: Secondary | ICD-10-CM | POA: Diagnosis not present

## 2016-07-17 DIAGNOSIS — F411 Generalized anxiety disorder: Secondary | ICD-10-CM | POA: Diagnosis not present

## 2016-07-17 DIAGNOSIS — J411 Mucopurulent chronic bronchitis: Secondary | ICD-10-CM

## 2016-07-17 DIAGNOSIS — E039 Hypothyroidism, unspecified: Secondary | ICD-10-CM | POA: Diagnosis not present

## 2016-07-17 MED ORDER — LEVOTHYROXINE SODIUM 75 MCG PO TABS: 75.0000 ug | ORAL_TABLET | Freq: Every day | ORAL | 5 refills | Status: DC

## 2016-07-17 MED ORDER — ATORVASTATIN CALCIUM 40 MG PO TABS: 40.0000 mg | ORAL_TABLET | Freq: Every day | ORAL | 5 refills | Status: DC

## 2016-07-17 MED ORDER — ALPRAZOLAM 0.5 MG PO TABS: 0.5000 mg | ORAL_TABLET | Freq: Three times a day (TID) | ORAL | 1 refills | Status: DC | PRN

## 2016-07-17 MED ORDER — BUDESONIDE-FORMOTEROL FUMARATE 80-4.5 MCG/ACT IN AERO: 2.0000 | INHALATION_SPRAY | Freq: Two times a day (BID) | RESPIRATORY_TRACT | 3 refills | Status: DC

## 2016-07-17 NOTE — Patient Instructions (Signed)
Steps to Quit Smoking Smoking tobacco can be bad for your health. It can also affect almost every organ in your body. Smoking puts you and people around you at risk for many serious long-lasting (chronic) diseases. Quitting smoking is hard, but it is one of the best things that you can do for your health. It is never too late to quit. What are the benefits of quitting smoking? When you quit smoking, you lower your risk for getting serious diseases and conditions. They can include:  Lung cancer or lung disease.  Heart disease.  Stroke.  Heart attack.  Not being able to have children (infertility).  Weak bones (osteoporosis) and broken bones (fractures).  If you have coughing, wheezing, and shortness of breath, those symptoms may get better when you quit. You may also get sick less often. If you are pregnant, quitting smoking can help to lower your chances of having a baby of low birth weight. What can I do to help me quit smoking? Talk with your doctor about what can help you quit smoking. Some things you can do (strategies) include:  Quitting smoking totally, instead of slowly cutting back how much you smoke over a period of time.  Going to in-person counseling. You are more likely to quit if you go to many counseling sessions.  Using resources and support systems, such as: ? Online chats with a counselor. ? Phone quitlines. ? Printed self-help materials. ? Support groups or group counseling. ? Text messaging programs. ? Mobile phone apps or applications.  Taking medicines. Some of these medicines may have nicotine in them. If you are pregnant or breastfeeding, do not take any medicines to quit smoking unless your doctor says it is okay. Talk with your doctor about counseling or other things that can help you.  Talk with your doctor about using more than one strategy at the same time, such as taking medicines while you are also going to in-person counseling. This can help make  quitting easier. What things can I do to make it easier to quit? Quitting smoking might feel very hard at first, but there is a lot that you can do to make it easier. Take these steps:  Talk to your family and friends. Ask them to support and encourage you.  Call phone quitlines, reach out to support groups, or work with a counselor.  Ask people who smoke to not smoke around you.  Avoid places that make you want (trigger) to smoke, such as: ? Bars. ? Parties. ? Smoke-break areas at work.  Spend time with people who do not smoke.  Lower the stress in your life. Stress can make you want to smoke. Try these things to help your stress: ? Getting regular exercise. ? Deep-breathing exercises. ? Yoga. ? Meditating. ? Doing a body scan. To do this, close your eyes, focus on one area of your body at a time from head to toe, and notice which parts of your body are tense. Try to relax the muscles in those areas.  Download or buy apps on your mobile phone or tablet that can help you stick to your quit plan. There are many free apps, such as QuitGuide from the CDC (Centers for Disease Control and Prevention). You can find more support from smokefree.gov and other websites.  This information is not intended to replace advice given to you by your health care provider. Make sure you discuss any questions you have with your health care provider. Document Released: 11/05/2008 Document   Revised: 09/07/2015 Document Reviewed: 05/26/2014 Elsevier Interactive Patient Education  2018 Elsevier Inc.  

## 2016-07-17 NOTE — Progress Notes (Signed)
Subjective:    Patient ID: Jennifer [name], female    DOB: 10-Apr-1960, 56 y.o.   MRN: [id number]  HPI Jennifer [name] is here today for follow up of chronic medical problem.  Outpatient Encounter Prescriptions as of 07/17/2016  Medication Sig  . ALPRAZolam (XANAX) 0.5 MG tablet Take 1 tablet (0.5 mg total) by mouth 3 (three) times daily as needed for sleep.  Marland Kitchen atorvastatin (LIPITOR) 40 MG tablet Take 1 tablet (40 mg total) by mouth daily.  Marland Kitchen levothyroxine (SYNTHROID, LEVOTHROID) 75 MCG tablet Take 1 tablet (75 mcg total) by mouth daily before breakfast.  . naproxen (NAPROSYN) 500 MG tablet Take 1 tablet (500 mg total) by mouth 2 (two) times daily with a meal.   No facility-administered encounter medications on file as of 07/17/2016.     1. Mucopurulent chronic bronchitis (HCC)  No recent flare ups- is currently on no inhalers  2. Hypothyroidism, unspecified type  No problems that she is aware of  3. Osteopenia, unspecified location  Does very little exercise  4. Hyperlipidemia with target LDL less than 100  Does not watch diet  5. GAD (generalized anxiety disorder)  She does take xanax 3x a day- have encouraged her in the past to try to wean off but she says her nerves are bad when she does not take her meds. GAD 7 : Generalized Anxiety Score 07/17/2016  Nervous, Anxious, on Edge 2  Control/stop worrying 3  Worry too much - different things 3  Trouble relaxing 2  Restless 1  Easily annoyed or irritable 1  Afraid - awful might happen 2  Total GAD 7 Score 14  Anxiety Difficulty Very difficult      New complaints: Patient still having neck pain- Saw specialist and has MRI and they stronly recommended surgery. Patient has follow up in 2 months to discuss. Rates pain currently 4/10 with radiation down right arm.    Review of Systems  Constitutional: Negative for activity change and appetite change.  HENT: Negative.   Eyes: Negative for pain.  Respiratory: Negative for shortness  of breath.   Cardiovascular: Negative for chest pain, palpitations and leg swelling.  Gastrointestinal: Negative for abdominal pain.  Endocrine: Negative for polydipsia.  Genitourinary: Negative.   Skin: Negative for rash.  Neurological: Negative for dizziness, weakness and headaches.  Hematological: Does not bruise/bleed easily.  Psychiatric/Behavioral: Negative.   All other systems reviewed and are negative.      Objective:   Physical Exam  Constitutional: She is oriented to person, place, and time. She appears well-developed and well-nourished.  HENT:  Nose: Nose normal.  Mouth/Throat: Oropharynx is clear and moist.  Eyes: EOM are normal.  Neck: Trachea normal, normal range of motion and full passive range of motion without pain. Neck supple. No JVD present. Carotid bruit is not present. No thyromegaly present.  Cardiovascular: Normal rate, regular rhythm, normal heart sounds and intact distal pulses.  Exam reveals no gallop and no friction rub.   No murmur heard. Pulmonary/Chest: Effort normal and breath sounds normal.  Abdominal: Soft. Bowel sounds are normal. She exhibits no distension and no mass. There is no tenderness.  Musculoskeletal: Normal range of motion.  Decrease ROM  Of cervical spine with pain on flexion and extension Decrease ROM of right shoulder with pain on extenxion and abduction. Grips equal bil  Lymphadenopathy:    She has no cervical adenopathy.  Neurological: She is alert and oriented to person, place, and time. She has normal reflexes.  Skin: Skin is warm and dry.  Psychiatric: She has a normal mood and affect. Her behavior is normal. Judgment and thought content normal.   BP 131/79   Pulse 84   Temp 97.1 F (36.2 C) (Oral)   Ht '5\' 3"'$  (1.6 m)   Wt 131 lb (59.4 kg)   BMI 23.21 kg/m         Assessment & Plan:  1. Mucopurulent chronic bronchitis ([place]) Stop cigarette smoking  2. Hypothyroidism, unspecified type - levothyroxine (SYNTHROID,  LEVOTHROID) 75 MCG tablet; Take 1 tablet (75 mcg total) by mouth daily before breakfast.  Dispense: 30 tablet; Refill: 5 - Thyroid Panel With TSH  3. Osteopenia, unspecified location Weight bearing exercises encouraged  4. Hyperlipidemia with target LDL less than 100 Low sodium diet - atorvastatin (LIPITOR) 40 MG tablet; Take 1 tablet (40 mg total) by mouth daily.  Dispense: 30 tablet; Refill: 5 - CMP14+EGFR - Lipid panel  5. GAD (generalized anxiety disorder) Stress management  6. Cervical radiculopathy Keep follow up with specialist    Labs pending Health maintenance reviewed Diet and exercise encouraged Continue all meds Follow up  In 6 months   [place], FNP

## 2016-07-18 LAB — LIPID PANEL
Chol/HDL Ratio: 4.5 ratio — ABNORMAL HIGH (ref 0.0–4.4)
Cholesterol, Total: 176 mg/dL (ref 100–199)
HDL: 39 mg/dL — ABNORMAL LOW (ref >39)
LDL CALC: 112 mg/dL — AB (ref 0–99)
Triglycerides: 124 mg/dL (ref 0–149)
VLDL CHOLESTEROL CAL: 25 mg/dL (ref 5–40)

## 2016-07-18 LAB — CMP14+EGFR
ALBUMIN: 4.5 g/dL (ref 3.5–5.5)
ALT: 8 IU/L (ref 0–32)
AST: 17 IU/L (ref 0–40)
Albumin/Globulin Ratio: 2 (ref 1.2–2.2)
Alkaline Phosphatase: 98 IU/L (ref 39–117)
BILIRUBIN TOTAL: 0.5 mg/dL (ref 0.0–1.2)
BUN / CREAT RATIO: 11 (ref 9–23)
BUN: 8 mg/dL (ref 6–24)
CHLORIDE: 107 mmol/L — AB (ref 96–106)
CO2: 23 mmol/L (ref 20–29)
CREATININE: 0.74 mg/dL (ref 0.57–1.00)
Calcium: 9.8 mg/dL (ref 8.7–10.2)
GFR calc non Af Amer: 90 mL/min/{1.73_m2} (ref >59)
GFR, EST AFRICAN AMERICAN: 103 mL/min/{1.73_m2} (ref >59)
GLOBULIN, TOTAL: 2.2 g/dL (ref 1.5–4.5)
GLUCOSE: 92 mg/dL (ref 65–99)
Potassium: 4.8 mmol/L (ref 3.5–5.2)
SODIUM: 144 mmol/L (ref 134–144)
TOTAL PROTEIN: 6.7 g/dL (ref 6.0–8.5)

## 2016-07-18 LAB — THYROID PANEL WITH TSH
Free Thyroxine Index: 2.7 (ref 1.2–4.9)
T3 Uptake Ratio: 31 % (ref 24–39)
T4 TOTAL: 8.8 ug/dL (ref 4.5–12.0)
TSH: 2.5 u[IU]/mL (ref 0.450–4.500)

## 2016-09-18 NOTE — Progress Notes (Signed)
Patient states she will do and return to our office

## 2016-12-27 ENCOUNTER — Telehealth: Payer: Self-pay

## 2016-12-27 NOTE — Telephone Encounter (Signed)
-----   Message from Jennifer PieriniMary-Margaret Martin, FNP sent at 12/26/2016  2:11 PM EST ----- Remind patient to please do hemoccult cards given at appointment

## 2016-12-27 NOTE — Telephone Encounter (Signed)
lmtcb

## 2017-01-18 ENCOUNTER — Encounter: Payer: Self-pay | Admitting: Nurse Practitioner

## 2017-01-18 ENCOUNTER — Ambulatory Visit: Payer: Medicaid Other | Admitting: Nurse Practitioner

## 2017-01-18 VITALS — BP 115/77 | HR 95 | Temp 97.3°F | Ht 63.0 in | Wt 127.0 lb

## 2017-01-18 DIAGNOSIS — M8580 Other specified disorders of bone density and structure, unspecified site: Secondary | ICD-10-CM

## 2017-01-18 DIAGNOSIS — F411 Generalized anxiety disorder: Secondary | ICD-10-CM | POA: Diagnosis not present

## 2017-01-18 DIAGNOSIS — E785 Hyperlipidemia, unspecified: Secondary | ICD-10-CM

## 2017-01-18 DIAGNOSIS — J411 Mucopurulent chronic bronchitis: Secondary | ICD-10-CM

## 2017-01-18 DIAGNOSIS — E039 Hypothyroidism, unspecified: Secondary | ICD-10-CM

## 2017-01-18 MED ORDER — BUDESONIDE-FORMOTEROL FUMARATE 80-4.5 MCG/ACT IN AERO: 2.0000 | INHALATION_SPRAY | Freq: Two times a day (BID) | RESPIRATORY_TRACT | 5 refills | Status: DC

## 2017-01-18 MED ORDER — ALPRAZOLAM 0.5 MG PO TABS: 0.5000 mg | ORAL_TABLET | Freq: Three times a day (TID) | ORAL | 1 refills | Status: DC | PRN

## 2017-01-18 MED ORDER — ATORVASTATIN CALCIUM 40 MG PO TABS: 40.0000 mg | ORAL_TABLET | Freq: Every day | ORAL | 5 refills | Status: DC

## 2017-01-18 NOTE — Progress Notes (Signed)
Subjective:    Patient ID: Kaelani Kendrick, female    DOB: Jul 16, 1960, 56 y.o.   MRN: [id number]  HPI  Jaileen Janelle is here today for follow up of chronic medical problem.  Outpatient Encounter Medications as of 01/18/2017  Medication Sig  . ALPRAZolam (XANAX) 0.5 MG tablet Take 1 tablet (0.5 mg total) by mouth 3 (three) times daily as needed for sleep.  Marland Kitchen atorvastatin (LIPITOR) 40 MG tablet Take 1 tablet (40 mg total) by mouth daily.  . budesonide-formoterol (SYMBICORT) 80-4.5 MCG/ACT inhaler Inhale 2 puffs into the lungs 2 (two) times daily.  Marland Kitchen levothyroxine (SYNTHROID, LEVOTHROID) 75 MCG tablet Take 1 tablet (75 mcg total) by mouth daily before breakfast.  . naproxen (NAPROSYN) 500 MG tablet Take 1 tablet (500 mg total) by mouth 2 (two) times daily with a meal.     1. Mucopurulent chronic bronchitis (HCC)  Doing well. Uses symbicort daily- denies frequent cough. Is smoking again  2. Hypothyroidism, unspecified type  No problems that she is aware of  3. Osteopenia, unspecified location  No c/o back pain. Last dexa was in 2016  4. Hyperlipidemia with target LDL less than 100  Tries to watch diet  5. GAD (generalized anxiety disorder)  Takes xanax tod- gets very stressed without. - she has been on this for many years. Refuses SSRI.    New complaints: None today  Social history: Lives with husband- on disability for anxiety    Review of Systems  Constitutional: Negative for activity change and appetite change.  HENT: Negative.   Eyes: Negative for pain.  Respiratory: Negative for shortness of breath.   Cardiovascular: Negative for chest pain, palpitations and leg swelling.  Gastrointestinal: Negative for abdominal pain.  Endocrine: Negative for polydipsia.  Genitourinary: Negative.   Skin: Negative for rash.  Neurological: Negative for dizziness, weakness and headaches.  Hematological: Does not bruise/bleed easily.  Psychiatric/Behavioral: Negative.   All other  systems reviewed and are negative.      Objective:   Physical Exam  Constitutional: She is oriented to person, place, and time. She appears well-developed and well-nourished.  HENT:  Nose: Nose normal.  Mouth/Throat: Oropharynx is clear and moist.  Eyes: EOM are normal.  Neck: Trachea normal, normal range of motion and full passive range of motion without pain. Neck supple. No JVD present. Carotid bruit is not present. No thyromegaly present.  Cardiovascular: Normal rate, regular rhythm, normal heart sounds and intact distal pulses. Exam reveals no gallop and no friction rub.  No murmur heard. Pulmonary/Chest: Effort normal and breath sounds normal.  Abdominal: Soft. Bowel sounds are normal. She exhibits no distension and no mass. There is no tenderness.  Musculoskeletal: Normal range of motion.  Lymphadenopathy:    She has no cervical adenopathy.  Neurological: She is alert and oriented to person, place, and time. She has normal reflexes.  Skin: Skin is warm and dry.  Psychiatric: She has a normal mood and affect. Her behavior is normal. Judgment and thought content normal.   BP 115/77   Pulse 95   Temp (!) 97.3 F (36.3 C) (Oral)   Ht '5\' 3"'$  (1.6 m)   Wt 127 lb (57.6 kg)   BMI 22.50 kg/m        Assessment & Plan:  1. Mucopurulent chronic bronchitis (HCC) Smoking cessation encouraged - budesonide-formoterol (SYMBICORT) 80-4.5 MCG/ACT inhaler; Inhale 2 puffs into the lungs 2 (two) times daily.  Dispense: 1 Inhaler; Refill: 5  2. Hypothyroidism, unspecified type - Thyroid  Panel With TSH  3. Osteopenia, unspecified location Weight bearing exercises - DG [facility] DEXA  4. Hyperlipidemia with target LDL less than 100 Low fat diet - CMP14+EGFR - Lipid panel - atorvastatin (LIPITOR) 40 MG tablet; Take 1 tablet (40 mg total) by mouth daily.  Dispense: 30 tablet; Refill: 5  5. GAD (generalized anxiety disorder) Stress management - ALPRAZolam (XANAX) 0.5 MG tablet; Take 1  tablet (0.5 mg total) by mouth 3 (three) times daily as needed for sleep.  Dispense: 90 tablet; Refill: 1    Labs pending Health maintenance reviewed Diet and exercise encouraged Continue all meds Follow up  In 6 months   Nicholson, FNP

## 2017-01-18 NOTE — Patient Instructions (Signed)
Steps to Quit Smoking Smoking tobacco can be bad for your health. It can also affect almost every organ in your body. Smoking puts you and people around you at risk for many serious long-lasting (chronic) diseases. Quitting smoking is hard, but it is one of the best things that you can do for your health. It is never too late to quit. What are the benefits of quitting smoking? When you quit smoking, you lower your risk for getting serious diseases and conditions. They can include:  Lung cancer or lung disease.  Heart disease.  Stroke.  Heart attack.  Not being able to have children (infertility).  Weak bones (osteoporosis) and broken bones (fractures).  If you have coughing, wheezing, and shortness of breath, those symptoms may get better when you quit. You may also get sick less often. If you are pregnant, quitting smoking can help to lower your chances of having a baby of low birth weight. What can I do to help me quit smoking? Talk with your doctor about what can help you quit smoking. Some things you can do (strategies) include:  Quitting smoking totally, instead of slowly cutting back how much you smoke over a period of time.  Going to in-person counseling. You are more likely to quit if you go to many counseling sessions.  Using resources and support systems, such as: ? Online chats with a counselor. ? Phone quitlines. ? Printed self-help materials. ? Support groups or group counseling. ? Text messaging programs. ? Mobile phone apps or applications.  Taking medicines. Some of these medicines may have nicotine in them. If you are pregnant or breastfeeding, do not take any medicines to quit smoking unless your doctor says it is okay. Talk with your doctor about counseling or other things that can help you.  Talk with your doctor about using more than one strategy at the same time, such as taking medicines while you are also going to in-person counseling. This can help make  quitting easier. What things can I do to make it easier to quit? Quitting smoking might feel very hard at first, but there is a lot that you can do to make it easier. Take these steps:  Talk to your family and friends. Ask them to support and encourage you.  Call phone quitlines, reach out to support groups, or work with a counselor.  Ask people who smoke to not smoke around you.  Avoid places that make you want (trigger) to smoke, such as: ? Bars. ? Parties. ? Smoke-break areas at work.  Spend time with people who do not smoke.  Lower the stress in your life. Stress can make you want to smoke. Try these things to help your stress: ? Getting regular exercise. ? Deep-breathing exercises. ? Yoga. ? Meditating. ? Doing a body scan. To do this, close your eyes, focus on one area of your body at a time from head to toe, and notice which parts of your body are tense. Try to relax the muscles in those areas.  Download or buy apps on your mobile phone or tablet that can help you stick to your quit plan. There are many free apps, such as QuitGuide from the CDC (Centers for Disease Control and Prevention). You can find more support from smokefree.gov and other websites.  This information is not intended to replace advice given to you by your health care provider. Make sure you discuss any questions you have with your health care provider. Document Released: 11/05/2008 Document   Revised: 09/07/2015 Document Reviewed: 05/26/2014 Elsevier Interactive Patient Education  2018 Elsevier Inc.  

## 2017-01-19 LAB — CMP14+EGFR
ALK PHOS: 117 IU/L (ref 39–117)
ALT: 9 IU/L (ref 0–32)
AST: 17 IU/L (ref 0–40)
Albumin/Globulin Ratio: 1.8 (ref 1.2–2.2)
Albumin: 4.4 g/dL (ref 3.5–5.5)
BILIRUBIN TOTAL: 0.4 mg/dL (ref 0.0–1.2)
BUN / CREAT RATIO: 11 (ref 9–23)
BUN: 8 mg/dL (ref 6–24)
CO2: 26 mmol/L (ref 20–29)
Calcium: 9.6 mg/dL (ref 8.7–10.2)
Chloride: 104 mmol/L (ref 96–106)
Creatinine, Ser: 0.76 mg/dL (ref 0.57–1.00)
GFR, EST AFRICAN AMERICAN: 100 mL/min/{1.73_m2} (ref >59)
GFR, EST NON AFRICAN AMERICAN: 87 mL/min/{1.73_m2} (ref >59)
GLUCOSE: 96 mg/dL (ref 65–99)
Globulin, Total: 2.4 g/dL (ref 1.5–4.5)
POTASSIUM: 4.7 mmol/L (ref 3.5–5.2)
SODIUM: 144 mmol/L (ref 134–144)
TOTAL PROTEIN: 6.8 g/dL (ref 6.0–8.5)

## 2017-01-19 LAB — THYROID PANEL WITH TSH
FREE THYROXINE INDEX: 2.4 (ref 1.2–4.9)
T3 UPTAKE RATIO: 29 % (ref 24–39)
T4, Total: 8.3 ug/dL (ref 4.5–12.0)
TSH: 2.6 u[IU]/mL (ref 0.450–4.500)

## 2017-01-19 LAB — LIPID PANEL
CHOL/HDL RATIO: 2.8 ratio (ref 0.0–4.4)
Cholesterol, Total: 113 mg/dL (ref 100–199)
HDL: 40 mg/dL (ref >39)
LDL Calculated: 53 mg/dL (ref 0–99)
TRIGLYCERIDES: 101 mg/dL (ref 0–149)
VLDL Cholesterol Cal: 20 mg/dL (ref 5–40)

## 2017-01-25 ENCOUNTER — Telehealth: Payer: Self-pay

## 2017-01-25 NOTE — Progress Notes (Signed)
Pt has appt 01/31/17 for dexa.

## 2017-01-25 NOTE — Telephone Encounter (Signed)
Patient presents to office with alprazolam rx that was given to her during her office visit on 01/18/17. She states that her psych MD prescribes these and she doesn't need this rx and wanted to return it. I accepted that rx and shredded per patients request.

## 2017-01-29 ENCOUNTER — Telehealth: Payer: Self-pay | Admitting: Nurse Practitioner

## 2017-01-29 NOTE — Telephone Encounter (Signed)
Pt was rescheduled for 02/07/17

## 2017-01-31 ENCOUNTER — Other Ambulatory Visit: Payer: Medicaid Other

## 2017-02-07 ENCOUNTER — Ambulatory Visit (INDEPENDENT_AMBULATORY_CARE_PROVIDER_SITE_OTHER): Payer: Medicaid Other

## 2017-02-07 DIAGNOSIS — M8589 Other specified disorders of bone density and structure, multiple sites: Secondary | ICD-10-CM | POA: Diagnosis not present

## 2017-02-07 DIAGNOSIS — M8588 Other specified disorders of bone density and structure, other site: Secondary | ICD-10-CM | POA: Diagnosis not present

## 2017-02-12 ENCOUNTER — Encounter: Payer: Self-pay | Admitting: *Deleted

## 2017-05-28 ENCOUNTER — Other Ambulatory Visit: Payer: Self-pay | Admitting: Nurse Practitioner

## 2017-05-28 DIAGNOSIS — E039 Hypothyroidism, unspecified: Secondary | ICD-10-CM

## 2017-07-19 ENCOUNTER — Encounter: Payer: Self-pay | Admitting: Nurse Practitioner

## 2017-07-19 ENCOUNTER — Ambulatory Visit: Payer: Medicaid Other | Admitting: Nurse Practitioner

## 2017-07-19 VITALS — BP 115/74 | HR 85 | Temp 97.0°F | Ht 63.0 in | Wt 130.0 lb

## 2017-07-19 DIAGNOSIS — M8580 Other specified disorders of bone density and structure, unspecified site: Secondary | ICD-10-CM | POA: Diagnosis not present

## 2017-07-19 DIAGNOSIS — F411 Generalized anxiety disorder: Secondary | ICD-10-CM | POA: Diagnosis not present

## 2017-07-19 DIAGNOSIS — E039 Hypothyroidism, unspecified: Secondary | ICD-10-CM | POA: Diagnosis not present

## 2017-07-19 DIAGNOSIS — E785 Hyperlipidemia, unspecified: Secondary | ICD-10-CM

## 2017-07-19 DIAGNOSIS — Z1231 Encounter for screening mammogram for malignant neoplasm of breast: Secondary | ICD-10-CM | POA: Diagnosis not present

## 2017-07-19 MED ORDER — ATORVASTATIN CALCIUM 40 MG PO TABS: 40.0000 mg | ORAL_TABLET | Freq: Every day | ORAL | 5 refills | Status: DC

## 2017-07-19 MED ORDER — LEVOTHYROXINE SODIUM 75 MCG PO TABS: 75.0000 ug | ORAL_TABLET | Freq: Every day | ORAL | 1 refills | Status: DC

## 2017-07-19 NOTE — Patient Instructions (Signed)
Bone Health Bones protect organs, store calcium, and anchor muscles. Good health habits, such as eating nutritious foods and exercising regularly, are important for maintaining healthy bones. They can also help to prevent a condition that causes bones to lose density and become weak and brittle (osteoporosis). Why is bone mass important? Bone mass refers to the amount of bone tissue that you have. The higher your bone mass, the stronger your bones. An important step toward having healthy bones throughout life is to have strong and dense bones during childhood. A young adult who has a high bone mass is more likely to have a high bone mass later in life. Bone mass at its greatest it is called peak bone mass. A large decline in bone mass occurs in older adults. In women, it occurs about the time of menopause. During this time, it is important to practice good health habits, because if more bone is lost than what is replaced, the bones will become less healthy and more likely to break (fracture). If you find that you have a low bone mass, you may be able to prevent osteoporosis or further bone loss by changing your diet and lifestyle. How can I find out if my bone mass is low? Bone mass can be measured with an X-ray test that is called a bone mineral density (BMD) test. This test is recommended for all women who are age 65 or older. It may also be recommended for men who are age 70 or older, or for people who are more likely to develop osteoporosis due to:  Having bones that break easily.  Having a long-term disease that weakens bones, such as kidney disease or rheumatoid arthritis.  Having menopause earlier than normal.  Taking medicine that weakens bones, such as steroids, thyroid hormones, or hormone treatment for breast cancer or prostate cancer.  Smoking.  Drinking three or more alcoholic drinks each day.  What are the nutritional recommendations for healthy bones? To have healthy bones, you  need to get enough of the right minerals and vitamins. Most nutrition experts recommend getting these nutrients from the foods that you eat. Nutritional recommendations vary from person to person. Ask your health care provider what is healthy for you. Here are some general guidelines. Calcium Recommendations Calcium is the most important (essential) mineral for bone health. Most people can get enough calcium from their diet, but supplements may be recommended for people who are at risk for osteoporosis. Good sources of calcium include:  Dairy products, such as low-fat or nonfat milk, cheese, and yogurt.  Dark green leafy vegetables, such as bok choy and broccoli.  Calcium-fortified foods, such as orange juice, cereal, bread, soy beverages, and tofu products.  Nuts, such as almonds.  Follow these recommended amounts for daily calcium intake:  Children, age 1?3: 700 mg.  Children, age 4?8: 1,000 mg.  Children, age 9?13: 1,300 mg.  Teens, age 14?18: 1,300 mg.  Adults, age 19?50: 1,000 mg.  Adults, age 51?70: ? Men: 1,000 mg. ? Women: 1,200 mg.  Adults, age 71 or older: 1,200 mg.  Pregnant and breastfeeding females: ? Teens: 1,300 mg. ? Adults: 1,000 mg.  Vitamin D Recommendations Vitamin D is the most essential vitamin for bone health. It helps the body to absorb calcium. Sunlight stimulates the skin to make vitamin D, so be sure to get enough sunlight. If you live in a cold climate or you do not get outside often, your health care provider may recommend that you take vitamin   D supplements. Good sources of vitamin D in your diet include:  Egg yolks.  Saltwater fish.  Milk and cereal fortified with vitamin D.  Follow these recommended amounts for daily vitamin D intake:  Children and teens, age 1?18: 600 international units.  Adults, age 50 or younger: 400-800 international units.  Adults, age 51 or older: 800-1,000 international units.  Other Nutrients Other nutrients  for bone health include:  Phosphorus. This mineral is found in meat, poultry, dairy foods, nuts, and legumes. The recommended daily intake for adult men and adult women is 700 mg.  Magnesium. This mineral is found in seeds, nuts, dark green vegetables, and legumes. The recommended daily intake for adult men is 400?420 mg. For adult women, it is 310?320 mg.  Vitamin K. This vitamin is found in green leafy vegetables. The recommended daily intake is 120 mg for adult men and 90 mg for adult women.  What type of physical activity is best for building and maintaining healthy bones? Weight-bearing and strength-building activities are important for building and maintaining peak bone mass. Weight-bearing activities cause muscles and bones to work against gravity. Strength-building activities increases muscle strength that supports bones. Weight-bearing and muscle-building activities include:  Walking and hiking.  Jogging and running.  Dancing.  Gym exercises.  Lifting weights.  Tennis and racquetball.  Climbing stairs.  Aerobics.  Adults should get at least 30 minutes of moderate physical activity on most days. Children should get at least 60 minutes of moderate physical activity on most days. Ask your health care provide what type of exercise is best for you. Where can I find more information? For more information, check out the following websites:  National Osteoporosis Foundation: http://nof.org/learn/basics  National Institutes of Health: http://www.niams.nih.gov/Health_Info/Bone/Bone_Health/bone_health_for_life.asp  This information is not intended to replace advice given to you by your health care provider. Make sure you discuss any questions you have with your health care provider. Document Released: 04/01/2003 Document Revised: 07/30/2015 Document Reviewed: 01/14/2014 Elsevier Interactive Patient Education  2018 Elsevier Inc.  

## 2017-07-19 NOTE — Progress Notes (Signed)
Subjective:    Patient ID: Jennifer [name], female    DOB: 13-Feb-1960, 57 y.o.   MRN: [id number]  Chief Complaint: Medical Management of Chronic Issues   HPI:  1. Hypothyroidism, unspecified type  No problems that she is aware of.  2. Hyperlipidemia with target LDL less than 100  She eats no fried foods. Walks 3x a week  3. GAD (generalized anxiety disorder)  She sees counselor at [facility] in [place] and they have her on xanax tid.  4. Osteopenia, unspecified location  Again walks 3 x a week. Last BMD test was done 02/07/17. t score -2.1. She is not taking any vitamin d which patient says that she has not started taking.    Outpatient Encounter Medications as of 07/19/2017  Medication Sig  . ALPRAZolam (XANAX) 0.5 MG tablet Take 1 tablet (0.5 mg total) by mouth 3 (three) times daily as needed for sleep.  Marland Kitchen atorvastatin (LIPITOR) 40 MG tablet Take 1 tablet (40 mg total) by mouth daily.  . budesonide-formoterol (SYMBICORT) 80-4.5 MCG/ACT inhaler Inhale 2 puffs into the lungs 2 (two) times daily.  Marland Kitchen levothyroxine (SYNTHROID, LEVOTHROID) 75 MCG tablet Take 1 tablet (75 mcg total) by mouth daily before breakfast.  . naproxen (NAPROSYN) 500 MG tablet Take 1 tablet (500 mg total) by mouth 2 (two) times daily with a meal.      New complaints: None today  Social history: Needs to have neck surgery for bulging disc which she is terrified to have done.    Review of Systems  Constitutional: Negative for activity change and appetite change.  HENT: Negative.   Eyes: Negative for pain.  Respiratory: Negative for shortness of breath.   Cardiovascular: Negative for chest pain, palpitations and leg swelling.  Gastrointestinal: Negative for abdominal pain.  Endocrine: Negative for polydipsia.  Genitourinary: Negative.   Musculoskeletal: Positive for neck pain.  Skin: Negative for rash.  Neurological: Negative for dizziness, weakness and headaches.  Hematological: Does not bruise/bleed  easily.  Psychiatric/Behavioral: Negative.   All other systems reviewed and are negative.      Objective:   Physical Exam  Constitutional: She is oriented to person, place, and time. She appears well-developed and well-nourished. No distress.  HENT:  Head: Normocephalic.  Nose: Nose normal.  Mouth/Throat: Oropharynx is clear and moist.  Eyes: Pupils are equal, round, and reactive to light. EOM are normal.  Neck: Normal range of motion. Neck supple. No JVD present. Carotid bruit is not present.  Cardiovascular: Normal rate, regular rhythm, normal heart sounds and intact distal pulses.  Pulmonary/Chest: Effort normal and breath sounds normal. No respiratory distress. She has no wheezes. She has no rales. She exhibits no tenderness.  Abdominal: Soft. Normal appearance, normal aorta and bowel sounds are normal. She exhibits no distension, no abdominal bruit, no pulsatile midline mass and no mass. There is no splenomegaly or hepatomegaly. There is no tenderness.  Musculoskeletal: Normal range of motion. She exhibits no edema.  Lymphadenopathy:    She has no cervical adenopathy.  Neurological: She is alert and oriented to person, place, and time. She has normal reflexes.  Skin: Skin is warm and dry.  Psychiatric: She has a normal mood and affect. Her behavior is normal. Judgment and thought content normal.  Nursing note and vitals reviewed.  BP 115/74   Pulse 85   Temp (!) 97 F (36.1 C) (Oral)   Ht 5' 3" (1.6 m)   Wt 130 lb (59 kg)   BMI 23.03 kg/m  Assessment & Plan:  [name] comes in today with chief complaint of Medical Management of Chronic Issues   Diagnosis and orders addressed:  1. Hypothyroidism, unspecified type - levothyroxine (SYNTHROID, LEVOTHROID) 75 MCG tablet; Take 1 tablet (75 mcg total) by mouth daily before breakfast.  Dispense: 90 tablet; Refill: 1 - Thyroid Panel With TSH  2. Hyperlipidemia with target LDL less than 100 Low fat diet -  atorvastatin (LIPITOR) 40 MG tablet; Take 1 tablet (40 mg total) by mouth daily.  Dispense: 30 tablet; Refill: 5 - CMP14+EGFR - Lipid panel  3. GAD (generalized anxiety disorder) Stress management Continue counseling with [facility]  4. Osteopenia, unspecified location Weight bearing exercises Encouraged daly supplement of calcium and vitamin d - VITAMIN D 25 Hydroxy (Vit-D Deficiency, Fractures)  5. Screening mammogram, encounter for - MM 3D SCREEN BREAST BILATERAL; Future   Labs pending Health Maintenance reviewed Diet and exercise encouraged  Follow up plan: 6 months   Mary-Margaret Hassell Done, FNP

## 2017-07-20 ENCOUNTER — Other Ambulatory Visit: Payer: Self-pay | Admitting: Nurse Practitioner

## 2017-07-20 LAB — CMP14+EGFR
A/G RATIO: 2.1 (ref 1.2–2.2)
ALBUMIN: 4.7 g/dL (ref 3.5–5.5)
ALK PHOS: 129 IU/L — AB (ref 39–117)
ALT: 13 IU/L (ref 0–32)
AST: 21 IU/L (ref 0–40)
BILIRUBIN TOTAL: 0.5 mg/dL (ref 0.0–1.2)
BUN / CREAT RATIO: 13 (ref 9–23)
BUN: 11 mg/dL (ref 6–24)
CO2: 25 mmol/L (ref 20–29)
CREATININE: 0.83 mg/dL (ref 0.57–1.00)
Calcium: 9.8 mg/dL (ref 8.7–10.2)
Chloride: 106 mmol/L (ref 96–106)
GFR calc Af Amer: 89 mL/min/{1.73_m2} (ref >59)
GFR calc non Af Amer: 77 mL/min/{1.73_m2} (ref >59)
GLOBULIN, TOTAL: 2.2 g/dL (ref 1.5–4.5)
Glucose: 86 mg/dL (ref 65–99)
POTASSIUM: 4.9 mmol/L (ref 3.5–5.2)
SODIUM: 144 mmol/L (ref 134–144)
Total Protein: 6.9 g/dL (ref 6.0–8.5)

## 2017-07-20 LAB — LIPID PANEL
CHOLESTEROL TOTAL: 111 mg/dL (ref 100–199)
Chol/HDL Ratio: 2.8 ratio (ref 0.0–4.4)
HDL: 39 mg/dL — ABNORMAL LOW (ref >39)
LDL CALC: 56 mg/dL (ref 0–99)
TRIGLYCERIDES: 80 mg/dL (ref 0–149)
VLDL Cholesterol Cal: 16 mg/dL (ref 5–40)

## 2017-07-20 LAB — THYROID PANEL WITH TSH
Free Thyroxine Index: 2.7 (ref 1.2–4.9)
T3 UPTAKE RATIO: 31 % (ref 24–39)
T4, Total: 8.8 ug/dL (ref 4.5–12.0)
TSH: 0.974 u[IU]/mL (ref 0.450–4.500)

## 2017-07-20 LAB — VITAMIN D 25 HYDROXY (VIT D DEFICIENCY, FRACTURES): Vit D, 25-Hydroxy: 28 ng/mL — ABNORMAL LOW (ref 30.0–100.0)

## 2017-07-20 MED ORDER — VITAMIN D (ERGOCALCIFEROL) 1.25 MG (50000 UNIT) PO CAPS: 50000.0000 [IU] | ORAL_CAPSULE | ORAL | 5 refills | Status: DC

## 2017-08-27 ENCOUNTER — Other Ambulatory Visit: Payer: Self-pay | Admitting: Nurse Practitioner

## 2017-08-27 DIAGNOSIS — E039 Hypothyroidism, unspecified: Secondary | ICD-10-CM

## 2017-09-10 DIAGNOSIS — F411 Generalized anxiety disorder: Secondary | ICD-10-CM | POA: Diagnosis not present

## 2017-10-01 ENCOUNTER — Other Ambulatory Visit: Payer: Self-pay | Admitting: Nurse Practitioner

## 2017-10-01 DIAGNOSIS — M25511 Pain in right shoulder: Secondary | ICD-10-CM

## 2017-10-02 ENCOUNTER — Other Ambulatory Visit: Payer: Self-pay | Admitting: Nurse Practitioner

## 2017-10-02 DIAGNOSIS — M25511 Pain in right shoulder: Secondary | ICD-10-CM

## 2017-10-26 ENCOUNTER — Other Ambulatory Visit: Payer: Self-pay | Admitting: Nurse Practitioner

## 2017-10-26 DIAGNOSIS — E785 Hyperlipidemia, unspecified: Secondary | ICD-10-CM

## 2017-10-29 ENCOUNTER — Other Ambulatory Visit: Payer: Self-pay | Admitting: Nurse Practitioner

## 2017-10-29 DIAGNOSIS — M25511 Pain in right shoulder: Secondary | ICD-10-CM

## 2017-11-26 DIAGNOSIS — F411 Generalized anxiety disorder: Secondary | ICD-10-CM | POA: Diagnosis not present

## 2017-11-27 ENCOUNTER — Other Ambulatory Visit: Payer: Self-pay | Admitting: Nurse Practitioner

## 2017-11-27 DIAGNOSIS — M25511 Pain in right shoulder: Secondary | ICD-10-CM

## 2017-12-27 ENCOUNTER — Other Ambulatory Visit: Payer: Self-pay | Admitting: Nurse Practitioner

## 2017-12-27 DIAGNOSIS — M25511 Pain in right shoulder: Secondary | ICD-10-CM

## 2017-12-28 NOTE — Telephone Encounter (Signed)
Aware to purchase Aleve.

## 2017-12-28 NOTE — Telephone Encounter (Signed)
Can take over the counter aleve.

## 2017-12-28 NOTE — Telephone Encounter (Signed)
Last seen 6./27/18  MMM

## 2018-01-18 ENCOUNTER — Ambulatory Visit (INDEPENDENT_AMBULATORY_CARE_PROVIDER_SITE_OTHER): Payer: Medicaid Other | Admitting: Nurse Practitioner

## 2018-01-18 ENCOUNTER — Encounter: Payer: Self-pay | Admitting: Nurse Practitioner

## 2018-01-18 VITALS — BP 124/73 | HR 100 | Temp 97.4°F | Ht 63.0 in | Wt 129.0 lb

## 2018-01-18 DIAGNOSIS — M8580 Other specified disorders of bone density and structure, unspecified site: Secondary | ICD-10-CM | POA: Diagnosis not present

## 2018-01-18 DIAGNOSIS — E039 Hypothyroidism, unspecified: Secondary | ICD-10-CM | POA: Diagnosis not present

## 2018-01-18 DIAGNOSIS — E785 Hyperlipidemia, unspecified: Secondary | ICD-10-CM | POA: Diagnosis not present

## 2018-01-18 DIAGNOSIS — F411 Generalized anxiety disorder: Secondary | ICD-10-CM

## 2018-01-18 DIAGNOSIS — F17200 Nicotine dependence, unspecified, uncomplicated: Secondary | ICD-10-CM

## 2018-01-18 MED ORDER — ATORVASTATIN CALCIUM 40 MG PO TABS
40.0000 mg | ORAL_TABLET | Freq: Every day | ORAL | 0 refills | Status: DC
Start: 2018-01-18 — End: 2018-04-25

## 2018-01-18 MED ORDER — ALPRAZOLAM 0.5 MG PO TABS: 0.5000 mg | ORAL_TABLET | Freq: Three times a day (TID) | ORAL | 2 refills | Status: DC | PRN

## 2018-01-18 MED ORDER — LEVOTHYROXINE SODIUM 75 MCG PO TABS: 75.0000 ug | ORAL_TABLET | Freq: Every day | ORAL | 1 refills | Status: DC

## 2018-01-18 MED ORDER — ALPRAZOLAM 0.5 MG PO TABS
0.5000 mg | ORAL_TABLET | Freq: Three times a day (TID) | ORAL | 1 refills | Status: DC | PRN
Start: 2018-01-18 — End: 2018-01-18

## 2018-01-18 NOTE — Progress Notes (Signed)
Subjective:    Patient ID: Jennifer [name], female    DOB: [birth year], 59 y.o.   MRN: [id number]   Chief Complaint: Medical Management of Chronic Issues   HPI:  1. Hypothyroidism, unspecified type  Not having any problems that she is aware of.  2. Osteopenia, unspecified location  Last bone density test was 02/07/17 with t score of -2.1. she is currently on vitamin d supplement daily.  3. Hyperlipidemia with target LDL less than 100  She does try to watch det but does no dedicated exercise.  4. GAD (generalized anxiety disorder)  Takes xanax 3x  daily as when she feels anxious. She sees pysch ever 3 months for these.  5.      Smoker          strated back several years ago. Smokes about 1/2 pack a day  Outpatient Encounter Medications as of 01/18/2018  Medication Sig  . ALPRAZolam (XANAX) 0.5 MG tablet Take 1 tablet (0.5 mg total) by mouth 3 (three) times daily as needed for sleep.  Marland Kitchen atorvastatin (LIPITOR) 40 MG tablet Take 1 tablet (40 mg total) by mouth daily.  . budesonide-formoterol (SYMBICORT) 80-4.5 MCG/ACT inhaler Inhale 2 puffs into the lungs 2 (two) times daily.  Marland Kitchen levothyroxine (SYNTHROID, LEVOTHROID) 75 MCG tablet Take 1 tablet (75 mcg total) by mouth daily before breakfast.  . naproxen (NAPROSYN) 500 MG tablet TAKE 1 TABLET BY MOUTH TWICE DAILY AS NEEDED  . Vitamin D, Ergocalciferol, (DRISDOL) 50000 units CAPS capsule Take 1 capsule (50,000 Units total) by mouth every 7 (seven) days.       New complaints: None today  Social history: lives with her husband   Review of Systems  Constitutional: Negative for activity change and appetite change.  HENT: Negative.   Eyes: Negative for pain.  Respiratory: Negative for shortness of breath.   Cardiovascular: Negative for chest pain, palpitations and leg swelling.  Gastrointestinal: Negative for abdominal pain.  Endocrine: Negative for polydipsia.  Genitourinary: Negative.   Skin: Negative for rash.  Neurological:  Negative for dizziness, weakness and headaches.  Hematological: Does not bruise/bleed easily.  Psychiatric/Behavioral: Negative.   All other systems reviewed and are negative.      Objective:   Physical Exam Vitals signs and nursing note reviewed.  Constitutional:      General: She is not in acute distress.    Appearance: Normal appearance. She is well-developed.  HENT:     Head: Normocephalic.     Nose: Nose normal.  Eyes:     Pupils: Pupils are equal, round, and reactive to light.  Neck:     Musculoskeletal: Normal range of motion and neck supple.     Vascular: No carotid bruit or JVD.  Cardiovascular:     Rate and Rhythm: Normal rate and regular rhythm.     Heart sounds: Normal heart sounds.  Pulmonary:     Effort: Pulmonary effort is normal. No respiratory distress.     Breath sounds: Normal breath sounds. No wheezing or rales.  Chest:     Chest wall: No tenderness.  Abdominal:     General: Bowel sounds are normal. There is no distension or abdominal bruit.     Palpations: Abdomen is soft. There is no hepatomegaly, splenomegaly, mass or pulsatile mass.     Tenderness: There is no abdominal tenderness.  Musculoskeletal: Normal range of motion.  Lymphadenopathy:     Cervical: No cervical adenopathy.  Skin:    General: Skin is warm and dry.  Neurological:     Mental Status: She is alert and oriented to person, place, and time.     Deep Tendon Reflexes: Reflexes are normal and symmetric.  Psychiatric:        Behavior: Behavior normal.        Thought Content: Thought content normal.        Judgment: Judgment normal.    BP 124/73   Pulse 100   Temp (!) 97.4 F (36.3 C) (Oral)   Ht _0  (1.6 m)   Wt 129 lb (58.5 kg)   BMI 22.85 kg/m         Assessment & Plan:  Jennifer [name] comes in today with chief complaint of Medical Management of Chronic Issues   Diagnosis and orders addressed:  1. Hypothyroidism, unspecified type - levothyroxine (SYNTHROID,  LEVOTHROID) 75 MCG tablet; Take 1 tablet (75 mcg total) by mouth daily before breakfast.  Dispense: 90 tablet; Refill: 1 - Thyroid Panel With TSH  2. Osteopenia, unspecified location Weight bearing exercises Daily vitamin d and calcium supplement  3. Hyperlipidemia with target LDL less than 100 Low fat diet - atorvastatin (LIPITOR) 40 MG tablet; Take 1 tablet (40 mg total) by mouth daily.  Dispense: 90 tablet; Refill: 0 - CMP14+EGFR - Lipid panel  4. GAD (generalized anxiety disorder) Stress management - ALPRAZolam (XANAX) 0.5 MG tablet; Take 1 tablet (0.5 mg total) by mouth 3 (three) times daily as needed for sleep.  Dispense: 90 tablet; Refill: 1  5. Smoker Smoking cessation encouraged   Labs pending Health Maintenance reviewed Diet and exercise encouraged  Follow up plan: 6 months   Mary-Margaret Hassell Done, FNP

## 2018-01-18 NOTE — Patient Instructions (Signed)
Steps to Quit Smoking    Smoking tobacco can be bad for your health. It can also affect almost every organ in your body. Smoking puts you and people around you at risk for many serious long-lasting (chronic) diseases. Quitting smoking is hard, but it is one of the best things that you can do for your health. It is never too late to quit.  What are the benefits of quitting smoking?  When you quit smoking, you lower your risk for getting serious diseases and conditions. They can include:  · Lung cancer or lung disease.  · Heart disease.  · Stroke.  · Heart attack.  · Not being able to have children (infertility).  · Weak bones (osteoporosis) and broken bones (fractures).  If you have coughing, wheezing, and shortness of breath, those symptoms may get better when you quit. You may also get sick less often. If you are pregnant, quitting smoking can help to lower your chances of having a baby of low birth weight.  What can I do to help me quit smoking?  Talk with your doctor about what can help you quit smoking. Some things you can do (strategies) include:  · Quitting smoking totally, instead of slowly cutting back how much you smoke over a period of time.  · Going to in-person counseling. You are more likely to quit if you go to many counseling sessions.  · Using resources and support systems, such as:  ? Online chats with a counselor.  ? Phone quitlines.  ? Printed self-help materials.  ? Support groups or group counseling.  ? Text messaging programs.  ? Mobile phone apps or applications.  · Taking medicines. Some of these medicines may have nicotine in them. If you are pregnant or breastfeeding, do not take any medicines to quit smoking unless your doctor says it is okay. Talk with your doctor about counseling or other things that can help you.  Talk with your doctor about using more than one strategy at the same time, such as taking medicines while you are also going to in-person counseling. This can help make  quitting easier.  What things can I do to make it easier to quit?  Quitting smoking might feel very hard at first, but there is a lot that you can do to make it easier. Take these steps:  · Talk to your family and friends. Ask them to support and encourage you.  · Call phone quitlines, reach out to support groups, or work with a counselor.  · Ask people who smoke to not smoke around you.  · Avoid places that make you want (trigger) to smoke, such as:  ? Bars.  ? Parties.  ? Smoke-break areas at work.  · Spend time with people who do not smoke.  · Lower the stress in your life. Stress can make you want to smoke. Try these things to help your stress:  ? Getting regular exercise.  ? Deep-breathing exercises.  ? Yoga.  ? Meditating.  ? Doing a body scan. To do this, close your eyes, focus on one area of your body at a time from head to toe, and notice which parts of your body are tense. Try to relax the muscles in those areas.  · Download or buy apps on your mobile phone or tablet that can help you stick to your quit plan. There are many free apps, such as QuitGuide from the CDC (Centers for Disease Control and Prevention). You can find more   support from smokefree.gov and other websites.  This information is not intended to replace advice given to you by your health care provider. Make sure you discuss any questions you have with your health care provider.  Document Released: 11/05/2008 Document Revised: 09/07/2015 Document Reviewed: 05/26/2014  Elsevier Interactive Patient Education © 2019 Elsevier Inc.

## 2018-01-18 NOTE — Addendum Note (Signed)
Addended by: Bennie PieriniMARTIN, MARY-MARGARET on: 01/18/2018 10:51 AM   Modules accepted: Orders

## 2018-01-19 LAB — CMP14+EGFR
ALT: 11 IU/L (ref 0–32)
AST: 18 IU/L (ref 0–40)
Albumin/Globulin Ratio: 2.3 — ABNORMAL HIGH (ref 1.2–2.2)
Albumin: 4.5 g/dL (ref 3.5–5.5)
Alkaline Phosphatase: 122 IU/L — ABNORMAL HIGH (ref 39–117)
BUN/Creatinine Ratio: 11 (ref 9–23)
BUN: 9 mg/dL (ref 6–24)
Bilirubin Total: 0.4 mg/dL (ref 0.0–1.2)
CALCIUM: 9.9 mg/dL (ref 8.7–10.2)
CO2: 23 mmol/L (ref 20–29)
Chloride: 106 mmol/L (ref 96–106)
Creatinine, Ser: 0.81 mg/dL (ref 0.57–1.00)
GFR calc Af Amer: 92 mL/min/{1.73_m2} (ref >59)
GFR, EST NON AFRICAN AMERICAN: 80 mL/min/{1.73_m2} (ref >59)
Globulin, Total: 2 g/dL (ref 1.5–4.5)
Glucose: 93 mg/dL (ref 65–99)
Potassium: 4.8 mmol/L (ref 3.5–5.2)
Sodium: 143 mmol/L (ref 134–144)
Total Protein: 6.5 g/dL (ref 6.0–8.5)

## 2018-01-19 LAB — LIPID PANEL
Chol/HDL Ratio: 2.6 ratio (ref 0.0–4.4)
Cholesterol, Total: 108 mg/dL (ref 100–199)
HDL: 41 mg/dL (ref >39)
LDL CALC: 53 mg/dL (ref 0–99)
TRIGLYCERIDES: 70 mg/dL (ref 0–149)
VLDL Cholesterol Cal: 14 mg/dL (ref 5–40)

## 2018-01-19 LAB — THYROID PANEL WITH TSH
Free Thyroxine Index: 3.3 (ref 1.2–4.9)
T3 Uptake Ratio: 34 % (ref 24–39)
T4, Total: 9.8 ug/dL (ref 4.5–12.0)
TSH: 0.659 u[IU]/mL (ref 0.450–4.500)

## 2018-01-22 ENCOUNTER — Other Ambulatory Visit: Payer: Self-pay | Admitting: Nurse Practitioner

## 2018-01-22 DIAGNOSIS — M25511 Pain in right shoulder: Secondary | ICD-10-CM

## 2018-01-29 ENCOUNTER — Other Ambulatory Visit: Payer: Self-pay | Admitting: Nurse Practitioner

## 2018-01-29 DIAGNOSIS — M25511 Pain in right shoulder: Secondary | ICD-10-CM

## 2018-01-31 ENCOUNTER — Other Ambulatory Visit: Payer: Self-pay | Admitting: *Deleted

## 2018-01-31 DIAGNOSIS — M25511 Pain in right shoulder: Secondary | ICD-10-CM

## 2018-01-31 MED ORDER — NAPROXEN 500 MG PO TABS: 500.0000 mg | ORAL_TABLET | Freq: Two times a day (BID) | ORAL | 2 refills | Status: DC | PRN

## 2018-01-31 NOTE — Progress Notes (Signed)
RX approved by provider but printed instead of sending electronically RX changed to electronic delivery and resubmitted Approved by MMM

## 2018-02-26 DIAGNOSIS — F411 Generalized anxiety disorder: Secondary | ICD-10-CM | POA: Diagnosis not present

## 2018-04-24 ENCOUNTER — Other Ambulatory Visit: Payer: Self-pay | Admitting: Nurse Practitioner

## 2018-04-24 DIAGNOSIS — E785 Hyperlipidemia, unspecified: Secondary | ICD-10-CM

## 2018-04-25 ENCOUNTER — Other Ambulatory Visit: Payer: Self-pay | Admitting: Nurse Practitioner

## 2018-04-25 DIAGNOSIS — M25511 Pain in right shoulder: Secondary | ICD-10-CM

## 2018-05-25 ENCOUNTER — Other Ambulatory Visit: Payer: Self-pay | Admitting: Nurse Practitioner

## 2018-05-25 DIAGNOSIS — M25511 Pain in right shoulder: Secondary | ICD-10-CM

## 2018-06-25 ENCOUNTER — Other Ambulatory Visit: Payer: Self-pay | Admitting: Nurse Practitioner

## 2018-06-25 DIAGNOSIS — M25511 Pain in right shoulder: Secondary | ICD-10-CM

## 2018-07-02 DIAGNOSIS — F411 Generalized anxiety disorder: Secondary | ICD-10-CM | POA: Diagnosis not present

## 2018-07-19 ENCOUNTER — Other Ambulatory Visit: Payer: Self-pay

## 2018-07-22 ENCOUNTER — Other Ambulatory Visit: Payer: Self-pay

## 2018-07-22 ENCOUNTER — Ambulatory Visit: Payer: Medicaid Other | Admitting: Nurse Practitioner

## 2018-07-22 ENCOUNTER — Encounter: Payer: Self-pay | Admitting: Nurse Practitioner

## 2018-07-22 VITALS — BP 116/70 | HR 85 | Temp 97.6°F | Ht 63.0 in | Wt 129.0 lb

## 2018-07-22 DIAGNOSIS — E785 Hyperlipidemia, unspecified: Secondary | ICD-10-CM

## 2018-07-22 DIAGNOSIS — M8580 Other specified disorders of bone density and structure, unspecified site: Secondary | ICD-10-CM

## 2018-07-22 DIAGNOSIS — F17200 Nicotine dependence, unspecified, uncomplicated: Secondary | ICD-10-CM

## 2018-07-22 DIAGNOSIS — E039 Hypothyroidism, unspecified: Secondary | ICD-10-CM | POA: Diagnosis not present

## 2018-07-22 DIAGNOSIS — F411 Generalized anxiety disorder: Secondary | ICD-10-CM

## 2018-07-22 DIAGNOSIS — M25511 Pain in right shoulder: Secondary | ICD-10-CM

## 2018-07-22 MED ORDER — LEVOTHYROXINE SODIUM 75 MCG PO TABS: 75.0000 ug | ORAL_TABLET | Freq: Every day | ORAL | 1 refills | Status: DC

## 2018-07-22 MED ORDER — ATORVASTATIN CALCIUM 40 MG PO TABS: 40.0000 mg | ORAL_TABLET | Freq: Every day | ORAL | 0 refills | Status: DC

## 2018-07-22 MED ORDER — NAPROXEN 500 MG PO TABS: 500.0000 mg | ORAL_TABLET | Freq: Two times a day (BID) | ORAL | 0 refills | Status: DC | PRN

## 2018-07-22 NOTE — Progress Notes (Signed)
Subjective:    Patient ID: Jennifer [name], female    DOB: [birth year], 58 y.o.   MRN: [id number]   Chief Complaint: Medical Management of Chronic Issues    HPI:  1. Hyperlipidemia with target LDL less than 100 Tries to watch diet and walks a couple of times a week  2. Hypothyroidism, unspecified type No problems that aware of.  3. Osteopenia, unspecified location Last dexascan was done 02/07/17 with t score of -2.3. is nit taking vitamin d.  4. GAD (generalized anxiety disorder) She Is on xanax TID and gets these from psych. She has follow up with psych every 3 months.   5. Smoker She smokes over a pack a day and has no desire to stop right now.    Outpatient Encounter Medications as of 07/22/2018  Medication Sig  . ALPRAZolam (XANAX) 0.5 MG tablet Take 1 tablet (0.5 mg total) by mouth 3 (three) times daily as needed for sleep.  Marland Kitchen atorvastatin (LIPITOR) 40 MG tablet Take 1 tablet (40 mg total) by mouth daily.  Marland Kitchen levothyroxine (SYNTHROID, LEVOTHROID) 75 MCG tablet Take 1 tablet (75 mcg total) by mouth daily before breakfast.  . naproxen (NAPROSYN) 500 MG tablet TAKE 1 TABLET BY MOUTH TWICE DAILY AS NEEDED.  . [DISCONTINUED] budesonide-formoterol (SYMBICORT) 80-4.5 MCG/ACT inhaler Inhale 2 puffs into the lungs 2 (two) times daily.     Past Surgical History:  Procedure Laterality Date  . LASER ABLATION OF THE CERVIX      Family History  Problem Relation Age of Onset  . Stroke Mother   . Kidney disease Mother   . Hip fracture Mother   . Emphysema Father   . Cancer Father   . Heart disease Sister   . Healthy Brother   . Thyroid disease Sister     New complaints: None today  Social history: Lives with her husband. She does not work     Review of Systems  Constitutional: Negative for activity change and appetite change.  HENT: Negative.   Eyes: Negative for pain.  Respiratory: Negative for shortness of breath.   Cardiovascular: Negative for chest pain,  palpitations and leg swelling.  Gastrointestinal: Negative for abdominal pain.  Endocrine: Negative for polydipsia.  Genitourinary: Negative.   Skin: Negative for rash.  Neurological: Negative for dizziness, weakness and headaches.  Hematological: Does not bruise/bleed easily.  Psychiatric/Behavioral: Negative.   All other systems reviewed and are negative.      Objective:   Physical Exam Vitals signs and nursing note reviewed.  Constitutional:      General: She is not in acute distress.    Appearance: Normal appearance. She is well-developed.  HENT:     Head: Normocephalic.     Nose: Nose normal.  Eyes:     Pupils: Pupils are equal, round, and reactive to light.  Neck:     Musculoskeletal: Normal range of motion and neck supple.     Vascular: No carotid bruit or JVD.  Cardiovascular:     Rate and Rhythm: Normal rate and regular rhythm.     Heart sounds: Normal heart sounds.  Pulmonary:     Effort: Pulmonary effort is normal. No respiratory distress.     Breath sounds: Normal breath sounds. No wheezing or rales.  Chest:     Chest wall: No tenderness.  Abdominal:     General: Bowel sounds are normal. There is no distension or abdominal bruit.     Palpations: Abdomen is soft. There is no hepatomegaly, splenomegaly,  mass or pulsatile mass.     Tenderness: There is no abdominal tenderness.  Musculoskeletal: Normal range of motion.  Lymphadenopathy:     Cervical: No cervical adenopathy.  Skin:    General: Skin is warm and dry.  Neurological:     Mental Status: She is alert and oriented to person, place, and time.     Deep Tendon Reflexes: Reflexes are normal and symmetric.  Psychiatric:        Behavior: Behavior normal.        Thought Content: Thought content normal.        Judgment: Judgment normal.    BP 116/70   Pulse 85   Temp 97.6 F (36.4 C) (Oral)   Ht '5\' 3"'$  (1.6 m)   Wt 129 lb (58.5 kg)   BMI 22.85 kg/m         Assessment & Plan:  Jennifer [name]  comes in today with chief complaint of Medical Management of Chronic Issues   Diagnosis and orders addressed:  1. Hyperlipidemia with target LDL less than 100 Low fat diet encouraged - atorvastatin (LIPITOR) 40 MG tablet; Take 1 tablet (40 mg total) by mouth daily.  Dispense: 90 tablet; Refill: 0 - CMP14+EGFR - Lipid panel  2. Hypothyroidism, unspecified type Labs pending - levothyroxine (SYNTHROID) 75 MCG tablet; Take 1 tablet (75 mcg total) by mouth daily before breakfast.  Dispense: 90 tablet; Refill: 1 - Thyroid Panel With TSH  3. Osteopenia, unspecified location Weight bearing exercises encouraged Daily vitamind and calcium supplement  4. GAD (generalized anxiety disorder) Stress management Keep follow up appointment with psych  5. Smoker Smoking cessation encouraged   Labs pending Health Maintenance reviewed- refuses all preventative testing Diet and exercise encouraged  Follow up plan: 6 months   Mary-Margaret Hassell Done, FNP

## 2018-07-22 NOTE — Patient Instructions (Signed)
Steps to Quit Smoking Smoking tobacco is the leading cause of preventable death. It can affect almost every organ in the body. Smoking puts you and people around you at risk for many serious, long-lasting (chronic) diseases. Quitting smoking can be hard, but it is one of the best things that you can do for your health. It is never too late to quit. How do I get ready to quit? When you decide to quit smoking, make a plan to help you succeed. Before you quit:  Pick a date to quit. Set a date within the next 2 weeks to give you time to prepare.  Write down the reasons why you are quitting. Keep this list in places where you will see it often.  Tell your family, friends, and co-workers that you are quitting. Their support is important.  Talk with your doctor about the choices that may help you quit.  Find out if your health insurance will pay for these treatments.  Know the people, places, things, and activities that make you want to smoke (triggers). Avoid them. What first steps can I take to quit smoking?  Throw away all cigarettes at home, at work, and in your car.  Throw away the things that you use when you smoke, such as ashtrays and lighters.  Clean your car. Make sure to empty the ashtray.  Clean your home, including curtains and carpets. What can I do to help me quit smoking? Talk with your doctor about taking medicines and seeing a counselor at the same time. You are more likely to succeed when you do both.  If you are pregnant or breastfeeding, talk with your doctor about counseling or other ways to quit smoking. Do not take medicine to help you quit smoking unless your doctor tells you to do so. To quit smoking: Quit right away  Quit smoking totally, instead of slowly cutting back on how much you smoke over a period of time.  Go to counseling. You are more likely to quit if you go to counseling sessions regularly. Take medicine You may take medicines to help you quit. Some  medicines need a prescription, and some you can buy over-the-counter. Some medicines may contain a drug called nicotine to replace the nicotine in cigarettes. Medicines may:  Help you to stop having the desire to smoke (cravings).  Help to stop the problems that come when you stop smoking (withdrawal symptoms). Your doctor may ask you to use:  Nicotine patches, gum, or lozenges.  Nicotine inhalers or sprays.  Non-nicotine medicine that is taken by mouth. Find resources Find resources and other ways to help you quit smoking and remain smoke-free after you quit. These resources are most helpful when you use them often. They include:  Online chats with a counselor.  Phone quitlines.  Printed self-help materials.  Support groups or group counseling.  Text messaging programs.  Mobile phone apps. Use apps on your mobile phone or tablet that can help you stick to your quit plan. There are many free apps for mobile phones and tablets as well as websites. Examples include Quit Guide from the CDC and smokefree.gov  What things can I do to make it easier to quit?   Talk to your family and friends. Ask them to support and encourage you.  Call a phone quitline (1-800-QUIT-NOW), reach out to support groups, or work with a counselor.  Ask people who smoke to not smoke around you.  Avoid places that make you want to smoke,   such as: ? Bars. ? Parties. ? Smoke-break areas at work.  Spend time with people who do not smoke.  Lower the stress in your life. Stress can make you want to smoke. Try these things to help your stress: ? Getting regular exercise. ? Doing deep-breathing exercises. ? Doing yoga. ? Meditating. ? Doing a body scan. To do this, close your eyes, focus on one area of your body at a time from head to toe. Notice which parts of your body are tense. Try to relax the muscles in those areas. How will I feel when I quit smoking? Day 1 to 3 weeks Within the first 24 hours,  you may start to have some problems that come from quitting tobacco. These problems are very bad 2-3 days after you quit, but they do not often last for more than 2-3 weeks. You may get these symptoms:  Mood swings.  Feeling restless, nervous, angry, or annoyed.  Trouble concentrating.  Dizziness.  Strong desire for high-sugar foods and nicotine.  Weight gain.  Trouble pooping (constipation).  Feeling like you may vomit (nausea).  Coughing or a sore throat.  Changes in how the medicines that you take for other issues work in your body.  Depression.  Trouble sleeping (insomnia). Week 3 and afterward After the first 2-3 weeks of quitting, you may start to notice more positive results, such as:  Better sense of smell and taste.  Less coughing and sore throat.  Slower heart rate.  Lower blood pressure.  Clearer skin.  Better breathing.  Fewer sick days. Quitting smoking can be hard. Do not give up if you fail the first time. Some people need to try a few times before they succeed. Do your best to stick to your quit plan, and talk with your doctor if you have any questions or concerns. Summary  Smoking tobacco is the leading cause of preventable death. Quitting smoking can be hard, but it is one of the best things that you can do for your health.  When you decide to quit smoking, make a plan to help you succeed.  Quit smoking right away, not slowly over a period of time.  When you start quitting, seek help from your doctor, family, or friends. This information is not intended to replace advice given to you by your health care provider. Make sure you discuss any questions you have with your health care provider. Document Released: 11/05/2008 Document Revised: 03/29/2018 Document Reviewed: 03/30/2018 Elsevier Patient Education  2020 [employer] Inc[employer]  

## 2018-07-23 LAB — LIPID PANEL
Chol/HDL Ratio: 2.9 ratio (ref 0.0–4.4)
Cholesterol, Total: 111 mg/dL (ref 100–199)
HDL: 38 mg/dL — ABNORMAL LOW (ref >39)
LDL Calculated: 54 mg/dL (ref 0–99)
Triglycerides: 96 mg/dL (ref 0–149)
VLDL Cholesterol Cal: 19 mg/dL (ref 5–40)

## 2018-07-23 LAB — CMP14+EGFR
ALT: 11 IU/L (ref 0–32)
AST: 18 IU/L (ref 0–40)
Albumin/Globulin Ratio: 2.4 — ABNORMAL HIGH (ref 1.2–2.2)
Albumin: 4.6 g/dL (ref 3.8–4.9)
Alkaline Phosphatase: 111 IU/L (ref 39–117)
BUN/Creatinine Ratio: 11 — ABNORMAL LOW (ref 12–28)
BUN: 10 mg/dL (ref 8–27)
Bilirubin Total: 0.4 mg/dL (ref 0.0–1.2)
CO2: 22 mmol/L (ref 20–29)
Calcium: 9.9 mg/dL (ref 8.7–10.3)
Chloride: 104 mmol/L (ref 96–106)
Creatinine, Ser: 0.95 mg/dL (ref 0.57–1.00)
GFR calc Af Amer: 75 mL/min/{1.73_m2} (ref >59)
GFR calc non Af Amer: 65 mL/min/{1.73_m2} (ref >59)
Globulin, Total: 1.9 g/dL (ref 1.5–4.5)
Glucose: 88 mg/dL (ref 65–99)
Potassium: 5.1 mmol/L (ref 3.5–5.2)
Sodium: 142 mmol/L (ref 134–144)
Total Protein: 6.5 g/dL (ref 6.0–8.5)

## 2018-07-23 LAB — VITAMIN D 25 HYDROXY (VIT D DEFICIENCY, FRACTURES): Vit D, 25-Hydroxy: 32.9 ng/mL (ref 30.0–100.0)

## 2018-07-23 LAB — THYROID PANEL WITH TSH
Free Thyroxine Index: 3.2 (ref 1.2–4.9)
T3 Uptake Ratio: 32 % (ref 24–39)
T4, Total: 10.1 ug/dL (ref 4.5–12.0)
TSH: 1.53 u[IU]/mL (ref 0.450–4.500)

## 2018-08-28 ENCOUNTER — Other Ambulatory Visit: Payer: Self-pay | Admitting: Nurse Practitioner

## 2018-08-28 DIAGNOSIS — M25511 Pain in right shoulder: Secondary | ICD-10-CM

## 2018-09-26 DIAGNOSIS — F410 Panic disorder [episodic paroxysmal anxiety] without agoraphobia: Secondary | ICD-10-CM | POA: Diagnosis not present

## 2018-09-27 ENCOUNTER — Other Ambulatory Visit: Payer: Self-pay | Admitting: Nurse Practitioner

## 2018-09-27 DIAGNOSIS — M25511 Pain in right shoulder: Secondary | ICD-10-CM

## 2018-10-26 ENCOUNTER — Other Ambulatory Visit: Payer: Self-pay | Admitting: Nurse Practitioner

## 2018-10-26 DIAGNOSIS — M25511 Pain in right shoulder: Secondary | ICD-10-CM

## 2018-10-26 DIAGNOSIS — E785 Hyperlipidemia, unspecified: Secondary | ICD-10-CM

## 2018-11-26 ENCOUNTER — Other Ambulatory Visit: Payer: Self-pay | Admitting: Nurse Practitioner

## 2018-11-26 DIAGNOSIS — M25511 Pain in right shoulder: Secondary | ICD-10-CM

## 2018-12-26 ENCOUNTER — Other Ambulatory Visit: Payer: Self-pay | Admitting: Nurse Practitioner

## 2018-12-26 DIAGNOSIS — M25511 Pain in right shoulder: Secondary | ICD-10-CM

## 2019-01-06 DIAGNOSIS — F410 Panic disorder [episodic paroxysmal anxiety] without agoraphobia: Secondary | ICD-10-CM | POA: Diagnosis not present

## 2019-01-20 ENCOUNTER — Telehealth: Payer: Self-pay | Admitting: Nurse Practitioner

## 2019-01-20 ENCOUNTER — Other Ambulatory Visit: Payer: Self-pay

## 2019-01-21 ENCOUNTER — Encounter: Payer: Self-pay | Admitting: Nurse Practitioner

## 2019-01-21 ENCOUNTER — Ambulatory Visit (INDEPENDENT_AMBULATORY_CARE_PROVIDER_SITE_OTHER): Payer: Medicaid Other | Admitting: Nurse Practitioner

## 2019-01-21 ENCOUNTER — Ambulatory Visit (INDEPENDENT_AMBULATORY_CARE_PROVIDER_SITE_OTHER): Payer: Medicaid Other

## 2019-01-21 VITALS — BP 109/77 | HR 94 | Temp 98.0°F | Resp 20 | Ht 63.0 in | Wt 129.0 lb

## 2019-01-21 DIAGNOSIS — E785 Hyperlipidemia, unspecified: Secondary | ICD-10-CM | POA: Diagnosis not present

## 2019-01-21 DIAGNOSIS — F411 Generalized anxiety disorder: Secondary | ICD-10-CM | POA: Diagnosis not present

## 2019-01-21 DIAGNOSIS — F17200 Nicotine dependence, unspecified, uncomplicated: Secondary | ICD-10-CM

## 2019-01-21 DIAGNOSIS — M8580 Other specified disorders of bone density and structure, unspecified site: Secondary | ICD-10-CM | POA: Diagnosis not present

## 2019-01-21 DIAGNOSIS — E039 Hypothyroidism, unspecified: Secondary | ICD-10-CM

## 2019-01-21 DIAGNOSIS — Z87891 Personal history of nicotine dependence: Secondary | ICD-10-CM | POA: Diagnosis not present

## 2019-01-21 MED ORDER — LEVOTHYROXINE SODIUM 75 MCG PO TABS: 75.0000 ug | ORAL_TABLET | Freq: Every day | ORAL | 1 refills | Status: DC

## 2019-01-21 MED ORDER — ATORVASTATIN CALCIUM 40 MG PO TABS: 40.0000 mg | ORAL_TABLET | Freq: Every day | ORAL | 1 refills | Status: DC

## 2019-01-21 NOTE — Progress Notes (Signed)
Subjective:    Patient ID: Jennifer [name], female    DOB: September 02, 1960, 58 y.o.   MRN: [id number]   Chief Complaint: Medical Management of Chronic Issues    HPI:  1. Hyperlipidemia with target LDL less than 100 Watches her diet and tries to avoid fatty foods. She does deny doing any exercise.  2. Hypothyroidism, unspecified type No problems that she is aware of  3. Osteopenia, unspecified location Last bone density test was done 02/07/17 with t score of -2.3. she does not take any vitamin d or calcium and no weight bearing exercise.  4. GAD (generalized anxiety disorder) She is on xanax 3x a day but gets her xanax from her therapist  5. Smoker Smokes over a pack a day.    Outpatient Encounter Medications as of 01/21/2019  Medication Sig  . ALPRAZolam (XANAX) 0.5 MG tablet Take 1 tablet (0.5 mg total) by mouth 3 (three) times daily as needed for sleep.  Marland Kitchen atorvastatin (LIPITOR) 40 MG tablet TAKE 1 TABLET BY MOUTH ONCE DAILY  . levothyroxine (SYNTHROID) 75 MCG tablet Take 1 tablet (75 mcg total) by mouth daily before breakfast.  . naproxen (NAPROSYN) 500 MG tablet TAKE 1 TABLET BY MOUTH TWICE DAILY AS NEEDED.     Past Surgical History:  Procedure Laterality Date  . LASER ABLATION OF THE CERVIX      Family History  Problem Relation Age of Onset  . Stroke Mother   . Kidney disease Mother   . Hip fracture Mother   . Emphysema Father   . Cancer Father   . Heart disease Sister   . Healthy Brother   . 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  Social history: Lives with her husband  Controlled substance contract: n/a     Review of Systems  Constitutional: Negative for diaphoresis.  Eyes: Negative for pain.  Respiratory: Negative for shortness of breath.   Cardiovascular: Negative for chest pain, palpitations and  leg swelling.  Gastrointestinal: Negative for abdominal pain.  Endocrine: Negative for polydipsia.  Musculoskeletal: Positive for neck pain.  Skin: Negative for rash.  Neurological: Negative for dizziness, weakness and headaches.  Hematological: Does not bruise/bleed easily.  All other systems reviewed and are negative.      Objective:   Physical Exam Vitals and nursing note reviewed.  Constitutional:      General: She is not in acute distress.    Appearance: Normal appearance. She is well-developed.  HENT:     Head: Normocephalic.     Nose: Nose normal.  Eyes:     Pupils: Pupils are equal, round, and reactive to light.  Neck:     Vascular: No carotid bruit or JVD.  Cardiovascular:     Rate and Rhythm: Normal rate and regular rhythm.     Heart sounds: Normal heart sounds.  Pulmonary:     Effort: Pulmonary effort is normal. No respiratory distress.     Breath sounds: Normal breath sounds. No wheezing or rales.  Chest:     Chest wall: No tenderness.  Abdominal:     General: Bowel sounds are normal. There is no distension or abdominal bruit.     Palpations: Abdomen is soft. There is no hepatomegaly, splenomegaly, mass or pulsatile mass.     Tenderness: There is no abdominal tenderness.  Musculoskeletal:  General: Normal range of motion.     Cervical back: Normal range of motion and neck supple.     Comments: FROM of cervical spine without pain  Lymphadenopathy:     Cervical: No cervical adenopathy.  Skin:    General: Skin is warm and dry.  Neurological:     Mental Status: She is alert and oriented to person, place, and time.     Deep Tendon Reflexes: Reflexes are normal and symmetric.  Psychiatric:        Behavior: Behavior normal.        Thought Content: Thought content normal.        Judgment: Judgment normal.     BP 109/77   Pulse 94   Temp 98 F (36.7 C) (Temporal)   Resp 20   Ht '5\' 3"'$  (1.6 m)   Wt 129 lb (58.5 kg)   SpO2 98%   BMI 22.85 kg/m    Chest x ray- No acute cardiopulmonary abnormality seen.- Preliminary reading by Ronnald Collum, FNP  [facility]  EKG- NSR- Mary-Margaret Hassell Done, FNP      Assessment & Plan:  Nakeia Calvi comes in today with chief complaint of Medical Management of Chronic Issues   Diagnosis and orders addressed:  1. Hyperlipidemia with target LDL less than 100 Continue low fat diet - CMP14+EGFR - Lipid panel - atorvastatin (LIPITOR) 40 MG tablet; Take 1 tablet (40 mg total) by mouth daily.  Dispense: 90 tablet; Refill: 1  2. Hypothyroidism, unspecified type - Thyroid Panel With TSH - levothyroxine (SYNTHROID) 75 MCG tablet; Take 1 tablet (75 mcg total) by mouth daily before breakfast.  Dispense: 90 tablet; Refill: 1  3. Osteopenia, unspecified location Weight bearing exercises encouraged vitamind and calcium supplement daily  4. GAD (generalized anxiety disorder) Continue with therapist  5. Smoker Smoking cessation encouraged. - EKG 12-Lead - DG Chest 2 View; Future   Labs pending Health Maintenance reviewed Diet and exercise encouraged  Follow up plan: 6 months   Mary-Margaret Hassell Done, FNP

## 2019-01-21 NOTE — Patient Instructions (Signed)

## 2019-01-22 LAB — CMP14+EGFR
ALT: 16 IU/L (ref 0–32)
AST: 22 IU/L (ref 0–40)
Albumin/Globulin Ratio: 1.9 (ref 1.2–2.2)
Albumin: 4.5 g/dL (ref 3.8–4.9)
Alkaline Phosphatase: 126 IU/L — ABNORMAL HIGH (ref 39–117)
BUN/Creatinine Ratio: 9 — ABNORMAL LOW (ref 12–28)
BUN: 8 mg/dL (ref 8–27)
Bilirubin Total: 0.6 mg/dL (ref 0.0–1.2)
CO2: 25 mmol/L (ref 20–29)
Calcium: 9.6 mg/dL (ref 8.7–10.3)
Chloride: 100 mmol/L (ref 96–106)
Creatinine, Ser: 0.85 mg/dL (ref 0.57–1.00)
GFR calc Af Amer: 86 mL/min/{1.73_m2} (ref >59)
GFR calc non Af Amer: 75 mL/min/{1.73_m2} (ref >59)
Globulin, Total: 2.4 g/dL (ref 1.5–4.5)
Glucose: 93 mg/dL (ref 65–99)
Potassium: 4.6 mmol/L (ref 3.5–5.2)
Sodium: 142 mmol/L (ref 134–144)
Total Protein: 6.9 g/dL (ref 6.0–8.5)

## 2019-01-22 LAB — LIPID PANEL
Chol/HDL Ratio: 3.1 ratio (ref 0.0–4.4)
Cholesterol, Total: 129 mg/dL (ref 100–199)
HDL: 42 mg/dL (ref >39)
LDL Chol Calc (NIH): 64 mg/dL (ref 0–99)
Triglycerides: 128 mg/dL (ref 0–149)
VLDL Cholesterol Cal: 23 mg/dL (ref 5–40)

## 2019-01-22 LAB — THYROID PANEL WITH TSH
Free Thyroxine Index: 2.2 (ref 1.2–4.9)
T3 Uptake Ratio: 27 % (ref 24–39)
T4, Total: 8 ug/dL (ref 4.5–12.0)
TSH: 1.55 u[IU]/mL (ref 0.450–4.500)

## 2019-01-28 ENCOUNTER — Other Ambulatory Visit: Payer: Self-pay | Admitting: Nurse Practitioner

## 2019-01-28 DIAGNOSIS — M25511 Pain in right shoulder: Secondary | ICD-10-CM

## 2019-03-03 ENCOUNTER — Other Ambulatory Visit: Payer: Self-pay | Admitting: Nurse Practitioner

## 2019-03-03 DIAGNOSIS — M25511 Pain in right shoulder: Secondary | ICD-10-CM

## 2019-04-29 ENCOUNTER — Other Ambulatory Visit: Payer: Self-pay | Admitting: Nurse Practitioner

## 2019-04-29 DIAGNOSIS — M25511 Pain in right shoulder: Secondary | ICD-10-CM

## 2019-05-01 DIAGNOSIS — F411 Generalized anxiety disorder: Secondary | ICD-10-CM | POA: Diagnosis not present

## 2019-06-27 ENCOUNTER — Other Ambulatory Visit: Payer: Self-pay | Admitting: Nurse Practitioner

## 2019-06-27 DIAGNOSIS — M25511 Pain in right shoulder: Secondary | ICD-10-CM

## 2019-07-22 ENCOUNTER — Other Ambulatory Visit: Payer: Self-pay

## 2019-07-22 ENCOUNTER — Ambulatory Visit (INDEPENDENT_AMBULATORY_CARE_PROVIDER_SITE_OTHER): Payer: Medicaid Other | Admitting: Nurse Practitioner

## 2019-07-22 ENCOUNTER — Encounter: Payer: Self-pay | Admitting: Nurse Practitioner

## 2019-07-22 VITALS — BP 113/74 | HR 89 | Temp 98.0°F | Resp 20 | Ht 63.0 in | Wt 131.0 lb

## 2019-07-22 DIAGNOSIS — M8588 Other specified disorders of bone density and structure, other site: Secondary | ICD-10-CM

## 2019-07-22 DIAGNOSIS — F17200 Nicotine dependence, unspecified, uncomplicated: Secondary | ICD-10-CM

## 2019-07-22 DIAGNOSIS — F411 Generalized anxiety disorder: Secondary | ICD-10-CM | POA: Diagnosis not present

## 2019-07-22 DIAGNOSIS — E039 Hypothyroidism, unspecified: Secondary | ICD-10-CM | POA: Diagnosis not present

## 2019-07-22 DIAGNOSIS — E785 Hyperlipidemia, unspecified: Secondary | ICD-10-CM | POA: Diagnosis not present

## 2019-07-22 NOTE — Progress Notes (Signed)
Subjective:    Patient ID: Jennifer [name], female    DOB: 02-18-1960, 59 y.o.   MRN: [id number]   Chief Complaint: Medical Management of Chronic Issues    HPI:  1. Hyperlipidemia with target LDL less than 100 Does watch diet and does little to no exercise. Lab Results  Component Value Date   CHOL 129 01/21/2019   HDL 42 01/21/2019   LDLCALC 64 01/21/2019   TRIG 128 01/21/2019   CHOLHDL 3.1 01/21/2019     2. Acquired hypothyroidism No problems that she is aware of. Lab Results  Component Value Date   TSH 1.550 01/21/2019     3. Osteopenia of lumbar spine Last dexascan was done 02/07/17 with t score of -2.3. she is not on vitamin d and calcium. Can not tolerate exercise due to constant neck pain.  4. GAD (generalized anxiety disorder) Is on xanax 3x a day. Sees psych every 3 months and they [rescribe this. Says she ois currently doing well. GAD 7 : Generalized Anxiety Score 07/22/2019 07/17/2016  Nervous, Anxious, on Edge 1 2  Control/stop worrying 1 3  Worry too much - different things 1 3  Trouble relaxing 0 2  Restless 0 1  Easily annoyed or irritable 0 1  Afraid - awful might happen 0 2  Total GAD 7 Score 3 14  Anxiety Difficulty Not difficult at all Very difficult      5. Smoker Smokes over a pack a day. Denies any sob.    Outpatient Encounter Medications as of 07/22/2019  Medication Sig  . ALPRAZolam (XANAX) 0.5 MG tablet Take 1 tablet (0.5 mg total) by mouth 3 (three) times daily as needed for sleep.  Marland Kitchen atorvastatin (LIPITOR) 40 MG tablet Take 1 tablet (40 mg total) by mouth daily.  Marland Kitchen levothyroxine (SYNTHROID) 75 MCG tablet Take 1 tablet (75 mcg total) by mouth daily before breakfast.  . naproxen (NAPROSYN) 500 MG tablet TAKE 1 TABLET BY MOUTH TWICE DAILY AS NEEDED.     Past Surgical History:  Procedure Laterality Date  . LASER ABLATION OF THE CERVIX      Family History  Problem Relation Age of Onset  . Stroke Mother   . Kidney disease Mother     . Hip fracture Mother   . Emphysema Father   . Cancer Father   . Heart disease Sister   . Healthy Brother   . 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  Eyes: Negative for pain.  Respiratory: Negative for shortness of breath.   Cardiovascular: Negative for chest pain, palpitations and leg swelling.  Gastrointestinal: Negative for abdominal pain.  Endocrine: Negative for polydipsia.  Skin: Negative for rash.  Neurological: Negative for dizziness, weakness and headaches.  Hematological: Does not bruise/bleed easily.  All other systems reviewed and are negative.      Objective:   Physical Exam Vitals and nursing note reviewed.  Constitutional:      General: She is not in acute distress.    Appearance: Normal appearance. She is well-developed.  HENT:     Head: Normocephalic.     Nose: Nose normal.  Eyes:     Pupils: Pupils are equal, round, and reactive to light.  Neck:     Vascular: No carotid bruit or JVD.  Cardiovascular:     Rate  and Rhythm: Normal rate and regular rhythm.     Heart sounds: Normal heart sounds.  Pulmonary:     Effort: Pulmonary effort is normal. No respiratory distress.     Breath sounds: Normal breath sounds. No wheezing or rales.  Chest:     Chest wall: No tenderness.  Abdominal:     General: Bowel sounds are normal. There is no distension or abdominal bruit.     Palpations: Abdomen is soft. There is no hepatomegaly, splenomegaly, mass or pulsatile mass.     Tenderness: There is no abdominal tenderness.  Musculoskeletal:        General: Normal range of motion.     Cervical back: Normal range of motion and neck supple.  Lymphadenopathy:     Cervical: No cervical adenopathy.  Skin:    General: Skin is warm and dry.  Neurological:     Mental Status: She is alert and  oriented to person, place, and time.     Deep Tendon Reflexes: Reflexes are normal and symmetric.  Psychiatric:        Behavior: Behavior normal.        Thought Content: Thought content normal.        Judgment: Judgment normal.     BP 113/74   Pulse 89   Temp 98 F (36.7 C) (Temporal)   Resp 20   Ht '5\' 3"'$  (1.6 m)   Wt 131 lb (59.4 kg)   SpO2 96%   BMI 23.21 kg/m        Assessment & Plan:  Rocky Rishel comes in today with chief complaint of Medical Management of Chronic Issues   Diagnosis and orders addressed:  1. Hyperlipidemia with target LDL less than 100 Low fat diet - CBC with Differential/Platelet - CMP14+EGFR - Lipid panel  2. Acquired hypothyroidism Labs pending - Thyroid Panel With TSH  3. Osteopenia of lumbar spine Weight bearing exercises if can tolerate  4. GAD (generalized anxiety disorder) Stress management Keep follow up with psych  5. Smoker Smoking cessation encouraged   Labs pending Health Maintenance reviewed Diet and exercise encouraged  Follow up plan: 6 months   [place], FNP

## 2019-07-22 NOTE — Patient Instructions (Signed)
Coping with Quitting Smoking  Quitting smoking is a physical and mental challenge. You will face cravings, withdrawal symptoms, and temptation. Before quitting, work with your health care provider to make a plan that can help you cope. Preparation can help you quit and keep you from giving in. How can I cope with cravings? Cravings usually last for 5-10 minutes. If you get through it, the craving will pass. Consider taking the following actions to help you cope with cravings:  Keep your mouth busy: ? Chew sugar-free gum. ? Suck on hard candies or a straw. ? Brush your teeth.  Keep your hands and body busy: ? Immediately change to a different activity when you feel a craving. ? Squeeze or play with a ball. ? Do an activity or a hobby, like making bead jewelry, practicing needlepoint, or working with wood. ? Mix up your normal routine. ? Take a short exercise break. Go for a quick walk or run up and down stairs. ? Spend time in public places where smoking is not allowed.  Focus on doing something kind or helpful for someone else.  Call a friend or family member to talk during a craving.  Join a support group.  Call a quit line, such as 1-800-QUIT-NOW.  Talk with your health care provider about medicines that might help you cope with cravings and make quitting easier for you. How can I deal with withdrawal symptoms? Your body may experience negative effects as it tries to get used to not having nicotine in the system. These effects are called withdrawal symptoms. They may include:  Feeling hungrier than normal.  Trouble concentrating.  Irritability.  Trouble sleeping.  Feeling depressed.  Restlessness and agitation.  Craving a cigarette. To manage withdrawal symptoms:  Avoid places, people, and activities that trigger your cravings.  Remember why you want to quit.  Get plenty of sleep.  Avoid coffee and other caffeinated drinks. These may worsen some of your  symptoms. How can I handle social situations? Social situations can be difficult when you are quitting smoking, especially in the first few weeks. To manage this, you can:  Avoid parties, bars, and other social situations where people might be smoking.  Avoid alcohol.  Leave right away if you have the urge to smoke.  Explain to your family and friends that you are quitting smoking. Ask for understanding and support.  Plan activities with friends or family where smoking is not an option. What are some ways I can cope with stress? Wanting to smoke may cause stress, and stress can make you want to smoke. Find ways to manage your stress. Relaxation techniques can help. For example:  Breathe slowly and deeply, in through your nose and out through your mouth.  Listen to soothing, relaxing music.  Talk with a family member or friend about your stress.  Light a candle.  Soak in a bath or take a shower.  Think about a peaceful place. What are some ways I can prevent weight gain? Be aware that many people gain weight after they quit smoking. However, not everyone does. To keep from gaining weight, have a plan in place before you quit and stick to the plan after you quit. Your plan should include:  Having healthy snacks. When you have a craving, it may help to: ? Eat plain popcorn, crunchy carrots, celery, or other cut vegetables. ? Chew sugar-free gum.  Changing how you eat: ? Eat small portion sizes at meals. ? Eat 4-6 small meals   throughout the day instead of 1-2 large meals a day. ? Be mindful when you eat. Do not watch television or do other things that might distract you as you eat.  Exercising regularly: ? Make time to exercise each day. If you do not have time for a long workout, do short bouts of exercise for 5-10 minutes several times a day. ? Do some form of strengthening exercise, like weight lifting, and some form of aerobic exercise, like running or swimming.  Drinking  plenty of water or other low-calorie or no-calorie drinks. Drink 6-8 glasses of water daily, or as much as instructed by your health care provider. Summary  Quitting smoking is a physical and mental challenge. You will face cravings, withdrawal symptoms, and temptation to smoke again. Preparation can help you as you go through these challenges.  You can cope with cravings by keeping your mouth busy (such as by chewing gum), keeping your body and hands busy, and making calls to family, friends, or a helpline for people who want to quit smoking.  You can cope with withdrawal symptoms by avoiding places where people smoke, avoiding drinks with caffeine, and getting plenty of rest.  Ask your health care provider about the different ways to prevent weight gain, avoid stress, and handle social situations. This information is not intended to replace advice given to you by your health care provider. Make sure you discuss any questions you have with your health care provider. Document Revised: 12/22/2016 Document Reviewed: 01/07/2016 Elsevier Patient Education  2020 [employer] Inc.  

## 2019-07-23 LAB — CMP14+EGFR
ALT: 17 IU/L (ref 0–32)
AST: 25 IU/L (ref 0–40)
Albumin/Globulin Ratio: 2.2 (ref 1.2–2.2)
Albumin: 4.6 g/dL (ref 3.8–4.8)
Alkaline Phosphatase: 137 IU/L — ABNORMAL HIGH (ref 48–121)
BUN/Creatinine Ratio: 10 — ABNORMAL LOW (ref 12–28)
BUN: 8 mg/dL (ref 8–27)
Bilirubin Total: 0.4 mg/dL (ref 0.0–1.2)
CO2: 24 mmol/L (ref 20–29)
Calcium: 9.6 mg/dL (ref 8.7–10.3)
Chloride: 102 mmol/L (ref 96–106)
Creatinine, Ser: 0.81 mg/dL (ref 0.57–1.00)
GFR calc Af Amer: 91 mL/min/{1.73_m2} (ref >59)
GFR calc non Af Amer: 79 mL/min/{1.73_m2} (ref >59)
Globulin, Total: 2.1 g/dL (ref 1.5–4.5)
Glucose: 91 mg/dL (ref 65–99)
Potassium: 4.9 mmol/L (ref 3.5–5.2)
Sodium: 140 mmol/L (ref 134–144)
Total Protein: 6.7 g/dL (ref 6.0–8.5)

## 2019-07-23 LAB — LIPID PANEL
Chol/HDL Ratio: 3.2 ratio (ref 0.0–4.4)
Cholesterol, Total: 134 mg/dL (ref 100–199)
HDL: 42 mg/dL (ref >39)
LDL Chol Calc (NIH): 74 mg/dL (ref 0–99)
Triglycerides: 94 mg/dL (ref 0–149)
VLDL Cholesterol Cal: 18 mg/dL (ref 5–40)

## 2019-07-23 LAB — CBC WITH DIFFERENTIAL/PLATELET
Basophils Absolute: 0 10*3/uL (ref 0.0–0.2)
Basos: 1 %
EOS (ABSOLUTE): 0.1 10*3/uL (ref 0.0–0.4)
Eos: 2 %
Hematocrit: 40.6 % (ref 34.0–46.6)
Hemoglobin: 13.8 g/dL (ref 11.1–15.9)
Immature Grans (Abs): 0 10*3/uL (ref 0.0–0.1)
Immature Granulocytes: 0 %
Lymphocytes Absolute: 1.8 10*3/uL (ref 0.7–3.1)
Lymphs: 29 %
MCH: 33.1 pg — ABNORMAL HIGH (ref 26.6–33.0)
MCHC: 34 g/dL (ref 31.5–35.7)
MCV: 97 fL (ref 79–97)
Monocytes Absolute: 0.4 10*3/uL (ref 0.1–0.9)
Monocytes: 7 %
Neutrophils Absolute: 3.8 10*3/uL (ref 1.4–7.0)
Neutrophils: 61 %
Platelets: 252 10*3/uL (ref 150–450)
RBC: 4.17 x10E6/uL (ref 3.77–5.28)
RDW: 13 % (ref 11.7–15.4)
WBC: 6.2 10*3/uL (ref 3.4–10.8)

## 2019-07-23 LAB — THYROID PANEL WITH TSH
Free Thyroxine Index: 2.3 (ref 1.2–4.9)
T3 Uptake Ratio: 28 % (ref 24–39)
T4, Total: 8.1 ug/dL (ref 4.5–12.0)
TSH: 1.86 u[IU]/mL (ref 0.450–4.500)

## 2019-07-25 ENCOUNTER — Other Ambulatory Visit: Payer: Self-pay | Admitting: Nurse Practitioner

## 2019-07-25 DIAGNOSIS — M25511 Pain in right shoulder: Secondary | ICD-10-CM

## 2019-07-29 ENCOUNTER — Other Ambulatory Visit: Payer: Self-pay | Admitting: Nurse Practitioner

## 2019-07-29 DIAGNOSIS — E785 Hyperlipidemia, unspecified: Secondary | ICD-10-CM

## 2019-08-07 DIAGNOSIS — F410 Panic disorder [episodic paroxysmal anxiety] without agoraphobia: Secondary | ICD-10-CM | POA: Diagnosis not present

## 2019-08-27 ENCOUNTER — Other Ambulatory Visit: Payer: Self-pay | Admitting: Nurse Practitioner

## 2019-08-27 DIAGNOSIS — E039 Hypothyroidism, unspecified: Secondary | ICD-10-CM

## 2019-09-24 ENCOUNTER — Other Ambulatory Visit: Payer: Self-pay | Admitting: Nurse Practitioner

## 2019-09-24 DIAGNOSIS — M25511 Pain in right shoulder: Secondary | ICD-10-CM

## 2019-10-25 ENCOUNTER — Other Ambulatory Visit: Payer: Self-pay | Admitting: Nurse Practitioner

## 2019-10-25 DIAGNOSIS — M25511 Pain in right shoulder: Secondary | ICD-10-CM

## 2019-11-13 DIAGNOSIS — F410 Panic disorder [episodic paroxysmal anxiety] without agoraphobia: Secondary | ICD-10-CM | POA: Diagnosis not present

## 2019-11-24 ENCOUNTER — Other Ambulatory Visit: Payer: Self-pay | Admitting: Nurse Practitioner

## 2019-11-24 DIAGNOSIS — M25511 Pain in right shoulder: Secondary | ICD-10-CM

## 2019-11-24 DIAGNOSIS — E039 Hypothyroidism, unspecified: Secondary | ICD-10-CM

## 2019-11-28 ENCOUNTER — Other Ambulatory Visit: Payer: Self-pay | Admitting: Nurse Practitioner

## 2019-11-28 DIAGNOSIS — M25511 Pain in right shoulder: Secondary | ICD-10-CM

## 2019-12-29 ENCOUNTER — Other Ambulatory Visit: Payer: Self-pay | Admitting: Nurse Practitioner

## 2019-12-29 DIAGNOSIS — M25511 Pain in right shoulder: Secondary | ICD-10-CM

## 2019-12-30 NOTE — Telephone Encounter (Signed)
Patient needs to be seen.

## 2020-01-01 NOTE — Telephone Encounter (Signed)
Patient has appointment scheduled.

## 2020-01-21 ENCOUNTER — Other Ambulatory Visit: Payer: Self-pay

## 2020-01-21 ENCOUNTER — Encounter: Payer: Self-pay | Admitting: Nurse Practitioner

## 2020-01-21 ENCOUNTER — Ambulatory Visit: Payer: Medicaid Other | Admitting: Nurse Practitioner

## 2020-01-21 VITALS — BP 119/81 | HR 94 | Temp 97.7°F | Resp 20 | Ht 63.0 in | Wt 131.0 lb

## 2020-01-21 DIAGNOSIS — E785 Hyperlipidemia, unspecified: Secondary | ICD-10-CM

## 2020-01-21 DIAGNOSIS — F17200 Nicotine dependence, unspecified, uncomplicated: Secondary | ICD-10-CM

## 2020-01-21 DIAGNOSIS — F411 Generalized anxiety disorder: Secondary | ICD-10-CM

## 2020-01-21 DIAGNOSIS — E039 Hypothyroidism, unspecified: Secondary | ICD-10-CM | POA: Diagnosis not present

## 2020-01-21 DIAGNOSIS — M8588 Other specified disorders of bone density and structure, other site: Secondary | ICD-10-CM | POA: Diagnosis not present

## 2020-01-21 MED ORDER — ATORVASTATIN CALCIUM 40 MG PO TABS: 40.0000 mg | ORAL_TABLET | Freq: Every day | ORAL | 1 refills | Status: DC

## 2020-01-21 MED ORDER — LEVOTHYROXINE SODIUM 75 MCG PO TABS: 75.0000 ug | ORAL_TABLET | Freq: Every day | ORAL | 1 refills | Status: DC

## 2020-01-21 NOTE — Progress Notes (Signed)
Subjective:    Patient ID: Jennifer [name], female    DOB: 09/06/1960, 59 y.o.   MRN: [id number]   Chief Complaint: Medical Management of Chronic Issues    HPI:  1. Hyperlipidemia with target LDL less than 100 Does try to watch diet but does some dedicated exercise. She says that she does stay active though. Lab Results  Component Value Date   CHOL 134 07/22/2019   HDL 42 07/22/2019   LDLCALC 74 07/22/2019   TRIG 94 07/22/2019   CHOLHDL 3.2 07/22/2019     2. Acquired hypothyroidism No problems that she is aware of. Lab Results  Component Value Date   TSH 1.860 07/22/2019     3. GAD (generalized anxiety disorder) She is on xanax 3x a day. She sees psych every 3 months and they prescribe her meds. GAD 7 : Generalized Anxiety Score 07/22/2019 07/17/2016  Nervous, Anxious, on Edge 1 2  Control/stop worrying 1 3  Worry too much - different things 1 3  Trouble relaxing 0 2  Restless 0 1  Easily annoyed or irritable 0 1  Afraid - awful might happen 0 2  Total GAD 7 Score 3 14  Anxiety Difficulty Not difficult at all Very difficult      4. Osteopenia of lumbar spine Last dexascan was done on 02/07/17. Her t score was -2.3.she does not want to repeat today. Wants to do at next visit.  5. Smoker Still smoke over a pack a day. She says it has been worse since covid.    Outpatient Encounter Medications as of 01/21/2020  Medication Sig  . ALPRAZolam (XANAX) 0.5 MG tablet Take 1 tablet (0.5 mg total) by mouth 3 (three) times daily as needed for sleep.  Marland Kitchen atorvastatin (LIPITOR) 40 MG tablet TAKE 1 TABLET BY MOUTH ONCE DAILY  . levothyroxine (SYNTHROID) 75 MCG tablet Take 1 tablet (75 mcg total) by mouth daily before breakfast.  . naproxen (NAPROSYN) 500 MG tablet TAKE 1 TABLET BY MOUTH TWICE DAILY AS NEEDED     Past Surgical History:  Procedure Laterality Date  . LASER ABLATION OF THE CERVIX      Family History  Problem Relation Age of Onset  . Stroke Mother   .  Kidney disease Mother   . Hip fracture Mother   . Emphysema Father   . Cancer Father   . Heart disease Sister   . Healthy Brother   . Thyroid disease Sister     New complaints: none today  Social history: Lives with her husband  Controlled substance contract: n/a    Review of Systems  Constitutional: Negative for diaphoresis.  Eyes: Negative for pain.  Respiratory: Negative for shortness of breath.   Cardiovascular: Negative for chest pain, palpitations and leg swelling.  Gastrointestinal: Negative for abdominal pain.  Endocrine: Negative for polydipsia.  Skin: Negative for rash.  Neurological: Negative for dizziness, weakness and headaches.  Hematological: Does not bruise/bleed easily.  All other systems reviewed and are negative.      Objective:   Physical Exam Vitals and nursing note reviewed.  Constitutional:      General: She is not in acute distress.    Appearance: Normal appearance. She is well-developed and well-nourished.  HENT:     Head: Normocephalic.     Nose: Nose normal.     Mouth/Throat:     Mouth: Oropharynx is clear and moist.  Eyes:     Extraocular Movements: EOM normal.     Pupils: Pupils  are equal, round, and reactive to light.  Neck:     Vascular: No carotid bruit or JVD.  Cardiovascular:     Rate and Rhythm: Normal rate and regular rhythm.     Pulses: Intact distal pulses.     Heart sounds: Normal heart sounds.  Pulmonary:     Effort: Pulmonary effort is normal. No respiratory distress.     Breath sounds: Normal breath sounds. No wheezing or rales.  Chest:     Chest wall: No tenderness.  Abdominal:     General: Bowel sounds are normal. There is no distension or abdominal bruit. Aorta is normal.     Palpations: Abdomen is soft. There is no hepatomegaly, splenomegaly, mass or pulsatile mass.     Tenderness: There is no abdominal tenderness.  Musculoskeletal:        General: No edema. Normal range of motion.     Cervical back: Normal  range of motion and neck supple.  Lymphadenopathy:     Cervical: No cervical adenopathy.  Skin:    General: Skin is warm and dry.  Neurological:     Mental Status: She is alert and oriented to person, place, and time.     Deep Tendon Reflexes: Reflexes are normal and symmetric.  Psychiatric:        Mood and Affect: Mood and affect normal.        Behavior: Behavior normal.        Thought Content: Thought content normal.        Judgment: Judgment normal.     BP 119/81   Pulse 94   Temp 97.7 F (36.5 C) (Temporal)   Resp 20   Ht _0  (1.6 m)   Wt 131 lb (59.4 kg)   BMI 23.21 kg/m        Assessment & Plan:  Brent Noto comes in today with chief complaint of Medical Management of Chronic Issues   Diagnosis and orders addressed:  1. Hyperlipidemia with target LDL less than 100 Low fat diet - CBC with Differential/Platelet - CMP14+EGFR - Lipid panel - atorvastatin (LIPITOR) 40 MG tablet; Take 1 tablet (40 mg total) by mouth daily.  Dispense: 90 tablet; Refill: 1  2. Acquired hypothyroidism - Thyroid Panel With TSH - levothyroxine (SYNTHROID) 75 MCG tablet; Take 1 tablet (75 mcg total) by mouth daily before breakfast.  Dispense: 90 tablet; Refill: 1  3. GAD (generalized anxiety disorder) Stress management Keep foll  wup with psych  4. Osteopenia of lumbar spine Will do dexscan at next visit  5. Smoker Smoking cessation encouraged    Labs pending Health Maintenance reviewed- patient to schedule mammogram  Diet and exercise encouraged  Follow up plan: 6 months   [place], FNP

## 2020-01-21 NOTE — Patient Instructions (Signed)
Exercising to Stay Healthy To become healthy and stay healthy, it is recommended that you do moderate-intensity and vigorous-intensity exercise. You can tell that you are exercising at a moderate intensity if your heart starts beating faster and you start breathing faster but can still hold a conversation. You can tell that you are exercising at a vigorous intensity if you are breathing much harder and faster and cannot hold a conversation while exercising. Exercising regularly is important. It has many health benefits, such as:  Improving overall fitness, flexibility, and endurance.  Increasing bone density.  Helping with weight control.  Decreasing body fat.  Increasing muscle strength.  Reducing stress and tension.  Improving overall health. How often should I exercise? Choose an activity that you enjoy, and set realistic goals. Your health care provider can help you make an activity plan that works for you. Exercise regularly as told by your health care provider. This may include:  Doing strength training two times a week, such as: ? Lifting weights. ? Using resistance bands. ? Push-ups. ? Sit-ups. ? Yoga.  Doing a certain intensity of exercise for a given amount of time. Choose from these options: ? A total of 150 minutes of moderate-intensity exercise every week. ? A total of 75 minutes of vigorous-intensity exercise every week. ? A mix of moderate-intensity and vigorous-intensity exercise every week. Children, pregnant women, people who have not exercised regularly, people who are overweight, and older adults may need to talk with a health care provider about what activities are safe to do. If you have a medical condition, be sure to talk with your health care provider before you start a new exercise program. What are some exercise ideas? Moderate-intensity exercise ideas include:  Walking 1 mile (1.6 km) in about 15  minutes.  Biking.  Hiking.  Golfing.  Dancing.  Water aerobics. Vigorous-intensity exercise ideas include:  Walking 4.5 miles (7.2 km) or more in about 1 hour.  Jogging or running 5 miles (8 km) in about 1 hour.  Biking 10 miles (16.1 km) or more in about 1 hour.  Lap swimming.  Roller-skating or in-line skating.  Cross-country skiing.  Vigorous competitive sports, such as football, basketball, and soccer.  Jumping rope.  Aerobic dancing. What are some everyday activities that can help me to get exercise?  Yard work, such as: ? Pushing a lawn mower. ? Raking and bagging leaves.  Washing your car.  Pushing a stroller.  Shoveling snow.  Gardening.  Washing windows or floors. How can I be more active in my day-to-day activities?  Use stairs instead of an elevator.  Take a walk during your lunch break.  If you drive, park your car farther away from your work or school.  If you take public transportation, get off one stop early and walk the rest of the way.  Stand up or walk around during all of your indoor phone calls.  Get up, stretch, and walk around every 30 minutes throughout the day.  Enjoy exercise with a friend. Support to continue exercising will help you keep a regular routine of activity. What guidelines can I follow while exercising?  Before you start a new exercise program, talk with your health care provider.  Do not exercise so much that you hurt yourself, feel dizzy, or get very short of breath.  Wear comfortable clothes and wear shoes with good support.  Drink plenty of water while you exercise to prevent dehydration or heat stroke.  Work out until your breathing   and your heartbeat get faster. Where to find more information  [place] Department of Health and Human Services: www.hhs.gov  Centers for Disease Control and Prevention (CDC): www.cdc.gov Summary  Exercising regularly is important. It will improve your overall fitness,  flexibility, and endurance.  Regular exercise also will improve your overall health. It can help you control your weight, reduce stress, and improve your bone density.  Do not exercise so much that you hurt yourself, feel dizzy, or get very short of breath.  Before you start a new exercise program, talk with your health care provider. This information is not intended to replace advice given to you by your health care provider. Make sure you discuss any questions you have with your health care provider. Document Revised: 12/22/2016 Document Reviewed: 11/30/2016 Elsevier Patient Education  2020 [employer]  

## 2020-01-22 LAB — CBC WITH DIFFERENTIAL/PLATELET
Basophils Absolute: 0 10*3/uL (ref 0.0–0.2)
Basos: 0 %
EOS (ABSOLUTE): 0 10*3/uL (ref 0.0–0.4)
Eos: 1 %
Hematocrit: 41 % (ref 34.0–46.6)
Hemoglobin: 13.8 g/dL (ref 11.1–15.9)
Immature Grans (Abs): 0 10*3/uL (ref 0.0–0.1)
Immature Granulocytes: 0 %
Lymphocytes Absolute: 1.7 10*3/uL (ref 0.7–3.1)
Lymphs: 32 %
MCH: 33.1 pg — ABNORMAL HIGH (ref 26.6–33.0)
MCHC: 33.7 g/dL (ref 31.5–35.7)
MCV: 98 fL — ABNORMAL HIGH (ref 79–97)
Monocytes Absolute: 0.3 10*3/uL (ref 0.1–0.9)
Monocytes: 7 %
Neutrophils Absolute: 3.1 10*3/uL (ref 1.4–7.0)
Neutrophils: 60 %
Platelets: 212 10*3/uL (ref 150–450)
RBC: 4.17 x10E6/uL (ref 3.77–5.28)
RDW: 12.7 % (ref 11.7–15.4)
WBC: 5.1 10*3/uL (ref 3.4–10.8)

## 2020-01-22 LAB — CMP14+EGFR
ALT: 13 IU/L (ref 0–32)
AST: 22 IU/L (ref 0–40)
Albumin/Globulin Ratio: 1.9 (ref 1.2–2.2)
Albumin: 4.3 g/dL (ref 3.8–4.8)
Alkaline Phosphatase: 141 IU/L — ABNORMAL HIGH (ref 44–121)
BUN/Creatinine Ratio: 13 (ref 12–28)
BUN: 9 mg/dL (ref 8–27)
Bilirubin Total: 0.5 mg/dL (ref 0.0–1.2)
CO2: 26 mmol/L (ref 20–29)
Calcium: 9.7 mg/dL (ref 8.7–10.3)
Chloride: 104 mmol/L (ref 96–106)
Creatinine, Ser: 0.72 mg/dL (ref 0.57–1.00)
GFR calc Af Amer: 105 mL/min/{1.73_m2} (ref >59)
GFR calc non Af Amer: 91 mL/min/{1.73_m2} (ref >59)
Globulin, Total: 2.3 g/dL (ref 1.5–4.5)
Glucose: 91 mg/dL (ref 65–99)
Potassium: 4.7 mmol/L (ref 3.5–5.2)
Sodium: 142 mmol/L (ref 134–144)
Total Protein: 6.6 g/dL (ref 6.0–8.5)

## 2020-01-22 LAB — LIPID PANEL
Chol/HDL Ratio: 3 ratio (ref 0.0–4.4)
Cholesterol, Total: 121 mg/dL (ref 100–199)
HDL: 41 mg/dL (ref >39)
LDL Chol Calc (NIH): 63 mg/dL (ref 0–99)
Triglycerides: 85 mg/dL (ref 0–149)
VLDL Cholesterol Cal: 17 mg/dL (ref 5–40)

## 2020-01-22 LAB — THYROID PANEL WITH TSH
Free Thyroxine Index: 2.2 (ref 1.2–4.9)
T3 Uptake Ratio: 27 % (ref 24–39)
T4, Total: 8.3 ug/dL (ref 4.5–12.0)
TSH: 1.8 u[IU]/mL (ref 0.450–4.500)

## 2020-01-26 ENCOUNTER — Other Ambulatory Visit: Payer: Self-pay | Admitting: Nurse Practitioner

## 2020-01-26 DIAGNOSIS — M25511 Pain in right shoulder: Secondary | ICD-10-CM

## 2020-03-09 DIAGNOSIS — F410 Panic disorder [episodic paroxysmal anxiety] without agoraphobia: Secondary | ICD-10-CM | POA: Diagnosis not present

## 2020-03-22 ENCOUNTER — Other Ambulatory Visit: Payer: Self-pay | Admitting: Nurse Practitioner

## 2020-03-22 DIAGNOSIS — M25511 Pain in right shoulder: Secondary | ICD-10-CM

## 2020-04-07 DIAGNOSIS — Z20828 Contact with and (suspected) exposure to other viral communicable diseases: Secondary | ICD-10-CM | POA: Diagnosis not present

## 2020-04-21 ENCOUNTER — Other Ambulatory Visit: Payer: Self-pay | Admitting: Nurse Practitioner

## 2020-04-21 DIAGNOSIS — E785 Hyperlipidemia, unspecified: Secondary | ICD-10-CM

## 2020-04-21 DIAGNOSIS — M25511 Pain in right shoulder: Secondary | ICD-10-CM

## 2020-05-21 ENCOUNTER — Other Ambulatory Visit: Payer: Self-pay | Admitting: Nurse Practitioner

## 2020-05-21 DIAGNOSIS — M25511 Pain in right shoulder: Secondary | ICD-10-CM

## 2020-06-22 ENCOUNTER — Other Ambulatory Visit: Payer: Self-pay | Admitting: Nurse Practitioner

## 2020-06-22 DIAGNOSIS — M25511 Pain in right shoulder: Secondary | ICD-10-CM

## 2020-07-05 DIAGNOSIS — F410 Panic disorder [episodic paroxysmal anxiety] without agoraphobia: Secondary | ICD-10-CM | POA: Diagnosis not present

## 2020-07-11 IMAGING — DX DG CHEST 2V
2 series · 2 of 2 positions shown · non-contrast
Comparison: March 02, 2014

CLINICAL DATA: History of tobacco use

EXAM:
CHEST - 2 VIEW

[chest pa]
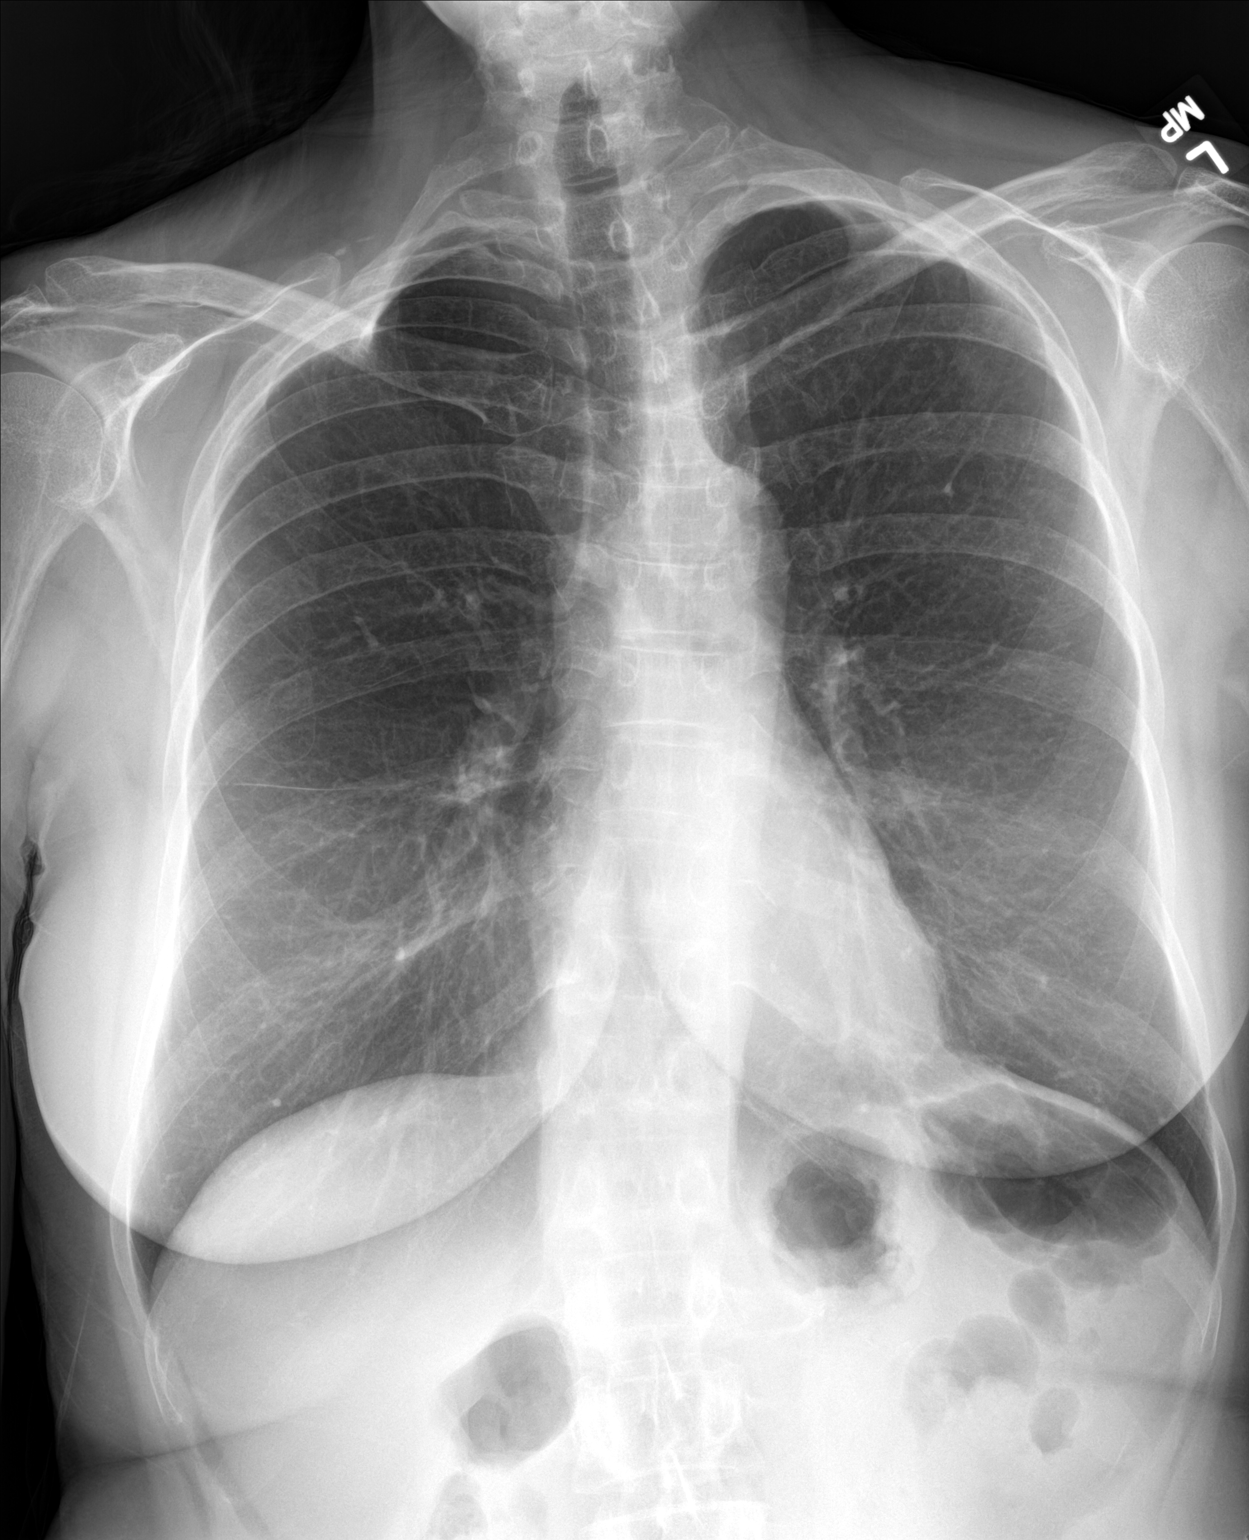

[chest lat]
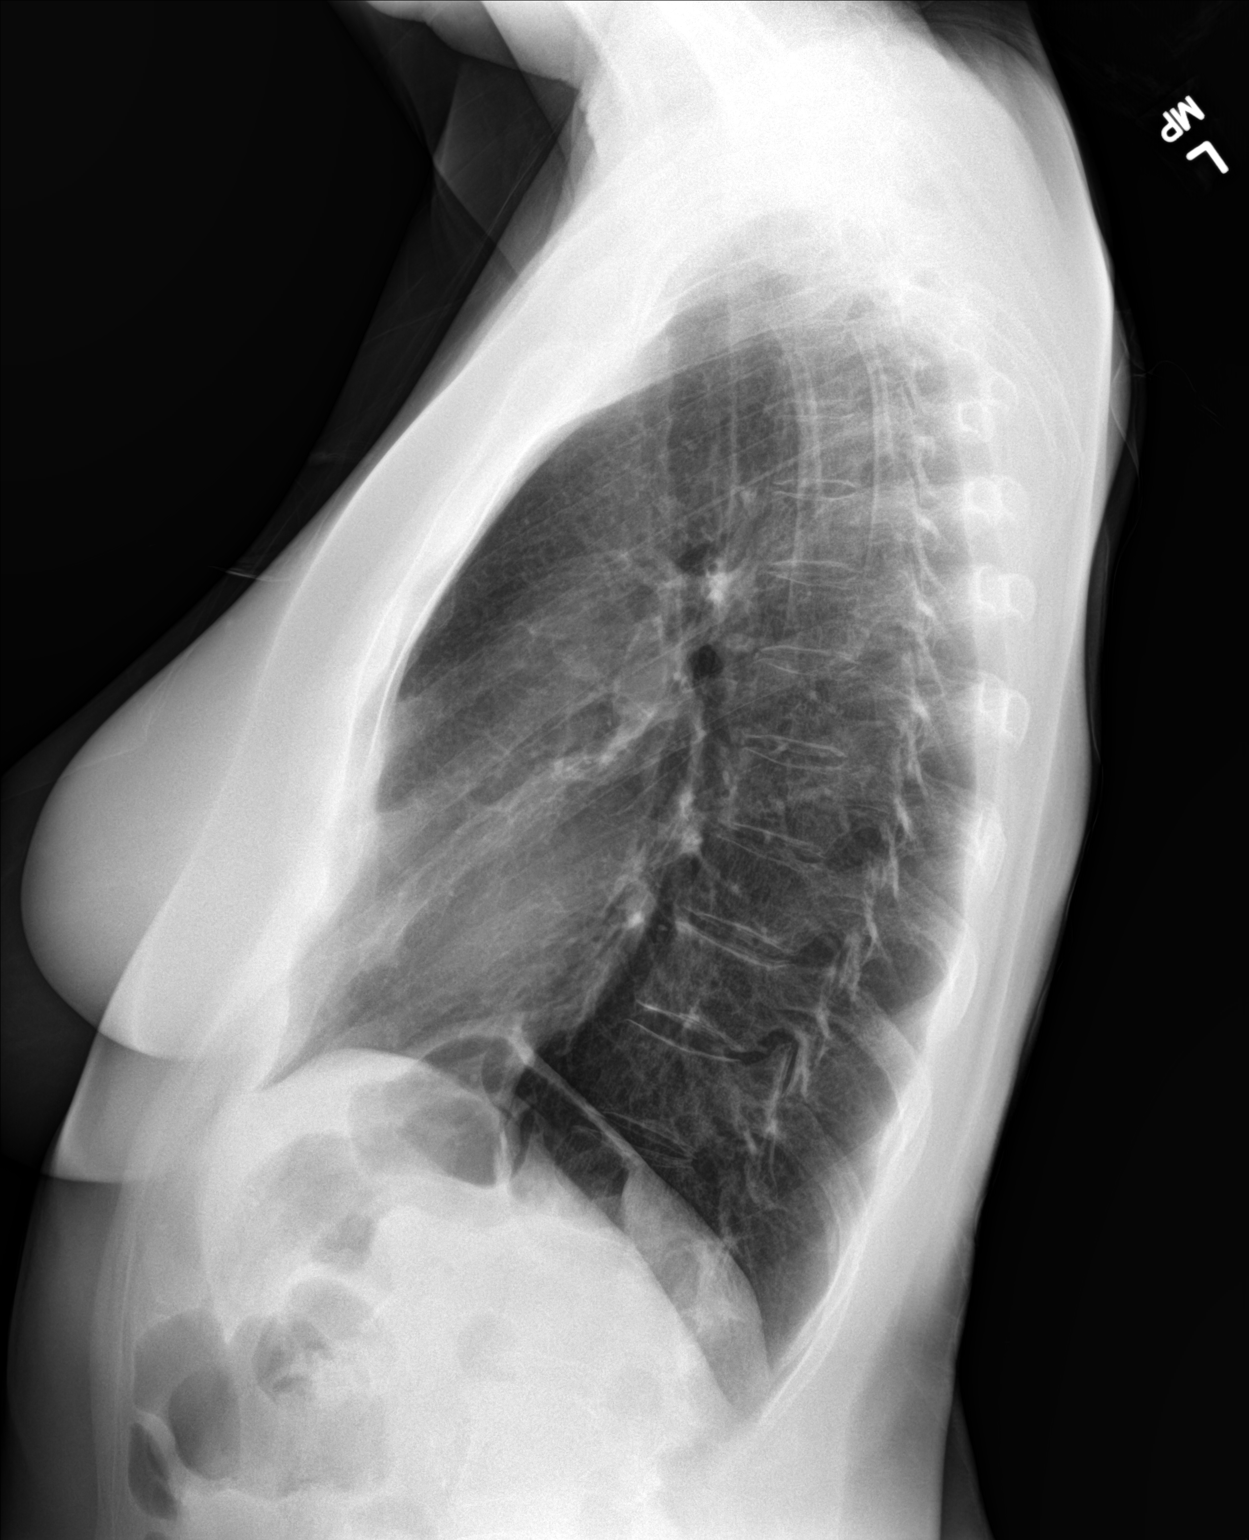

[2 of 2 positions shown; findings below may reference images not displayed]

FINDINGS: No edema or consolidation. There is questionable bullous disease in
the upper lobes. Heart size is normal. There is relative diminished
pulmonary vascularity in the upper lobes, a stable finding that may
be indicative of upper lobe bullous disease. No adenopathy. No bone
lesions.
IMPRESSION: Question upper lobe bullous disease, stable. No edema or
consolidation. Stable cardiac silhouette. No evident adenopathy.

## 2020-07-23 ENCOUNTER — Ambulatory Visit: Payer: Medicaid Other | Admitting: Nurse Practitioner

## 2020-07-23 ENCOUNTER — Other Ambulatory Visit: Payer: Self-pay

## 2020-07-23 ENCOUNTER — Encounter: Payer: Self-pay | Admitting: Nurse Practitioner

## 2020-07-23 VITALS — BP 114/76 | HR 90 | Temp 97.6°F | Resp 20 | Ht 63.0 in | Wt 130.0 lb

## 2020-07-23 DIAGNOSIS — M8588 Other specified disorders of bone density and structure, other site: Secondary | ICD-10-CM

## 2020-07-23 DIAGNOSIS — E039 Hypothyroidism, unspecified: Secondary | ICD-10-CM

## 2020-07-23 DIAGNOSIS — E785 Hyperlipidemia, unspecified: Secondary | ICD-10-CM | POA: Diagnosis not present

## 2020-07-23 DIAGNOSIS — F411 Generalized anxiety disorder: Secondary | ICD-10-CM

## 2020-07-23 DIAGNOSIS — F17200 Nicotine dependence, unspecified, uncomplicated: Secondary | ICD-10-CM

## 2020-07-23 MED ORDER — LEVOTHYROXINE SODIUM 75 MCG PO TABS: 75.0000 ug | ORAL_TABLET | Freq: Every day | ORAL | 1 refills | Status: DC

## 2020-07-23 MED ORDER — ATORVASTATIN CALCIUM 40 MG PO TABS: 40.0000 mg | ORAL_TABLET | Freq: Every day | ORAL | 1 refills | Status: DC

## 2020-07-23 NOTE — Progress Notes (Signed)
Subjective:    Patient ID: Jennifer [name], female    DOB: 1960-03-03, 60 y.o.   MRN: [id number]   Chief Complaint: Medical Management of Chronic Issues    HPI:  1. Hyperlipidemia with target LDL less than 100 Does try to watch diet and does little to no exercise. Is taking lipitor daily. Lab Results  Component Value Date   CHOL 121 01/21/2020   HDL 41 01/21/2020   LDLCALC 63 01/21/2020   TRIG 85 01/21/2020   CHOLHDL 3.0 01/21/2020     2. Acquired hypothyroidism No problems that she is aware of. Lab Results  Component Value Date   TSH 1.800 01/21/2020     3. Osteopenia of lumbar spine Last dexascan was done on 02/07/17. T score was -2.3. She does no weight bearing exercises.   4. GAD (generalized anxiety disorder) She sees psych every 6 months. She gets xanax from psych. GAD 7 : Generalized Anxiety Score 07/23/2020 07/22/2019 07/17/2016  Nervous, Anxious, on Edge 0 1 2  Control/stop worrying 0 1 3  Worry too much - different things 0 1 3  Trouble relaxing 0 0 2  Restless 0 0 1  Easily annoyed or irritable 0 0 1  Afraid - awful might happen 0 0 2  Total GAD 7 Score 0 3 14  Anxiety Difficulty Not difficult at all Not difficult at all Very difficult      5. Smoker Smokes 1/2 pack a day. She has been smoking for over 40 years.    Outpatient Encounter Medications as of 07/23/2020  Medication Sig   ALPRAZolam (XANAX) 0.5 MG tablet Take 1 tablet (0.5 mg total) by mouth 3 (three) times daily as needed for sleep.   atorvastatin (LIPITOR) 40 MG tablet Take 1 tablet (40 mg total) by mouth daily.   levothyroxine (SYNTHROID) 75 MCG tablet Take 1 tablet (75 mcg total) by mouth daily before breakfast.   naproxen (NAPROSYN) 500 MG tablet TAKE 1 TABLET BY MOUTH TWICE DAILY AS NEEDED   No facility-administered encounter medications on file as of 07/23/2020.    Past Surgical History:  Procedure Laterality Date   LASER ABLATION OF THE CERVIX      Family History  Problem  Relation Age of Onset   Stroke Mother    Kidney disease Mother    Hip fracture Mother    Emphysema Father    Cancer Father    Heart disease Sister    Healthy Brother    Thyroid disease Sister     New complaints: none today  Social history: Lives with her husband  Controlled substance contract: n/a     Review of Systems  Constitutional:  Negative for diaphoresis.  Eyes:  Negative for pain.  Respiratory:  Negative for shortness of breath.   Cardiovascular:  Negative for chest pain, palpitations and leg swelling.  Gastrointestinal:  Negative for abdominal pain.  Endocrine: Negative for polydipsia.  Skin:  Negative for rash.  Neurological:  Negative for dizziness, weakness and headaches.  Hematological:  Does not bruise/bleed easily.  All other systems reviewed and are negative.     Objective:   Physical Exam Vitals and nursing note reviewed.  Constitutional:      General: She is not in acute distress.    Appearance: Normal appearance. She is well-developed.  HENT:     Head: Normocephalic.     Right Ear: Tympanic membrane normal.     Left Ear: Tympanic membrane normal.     Nose: Nose normal.  Mouth/Throat:     Mouth: Mucous membranes are moist.  Eyes:     Pupils: Pupils are equal, round, and reactive to light.  Neck:     Vascular: No carotid bruit or JVD.  Cardiovascular:     Rate and Rhythm: Normal rate and regular rhythm.     Heart sounds: Normal heart sounds.  Pulmonary:     Effort: Pulmonary effort is normal. No respiratory distress.     Breath sounds: Normal breath sounds. No wheezing or rales.  Chest:     Chest wall: No tenderness.  Abdominal:     General: Bowel sounds are normal. There is no distension or abdominal bruit.     Palpations: Abdomen is soft. There is no hepatomegaly, splenomegaly, mass or pulsatile mass.     Tenderness: There is no abdominal tenderness.  Musculoskeletal:        General: Normal range of motion.     Cervical back:  Normal range of motion and neck supple.  Lymphadenopathy:     Cervical: No cervical adenopathy.  Skin:    General: Skin is warm and dry.  Neurological:     Mental Status: She is alert and oriented to person, place, and time.     Deep Tendon Reflexes: Reflexes are normal and symmetric.  Psychiatric:        Behavior: Behavior normal.        Thought Content: Thought content normal.        Judgment: Judgment normal.   BP 114/76   Pulse 90   Temp 97.6 F (36.4 C) (Temporal)   Resp 20   Ht $R'5\' 3"'El$  (1.6 m)   Wt 130 lb (59 kg)   SpO2 95%   BMI 23.03 kg/m         Assessment & Plan:  Autie Vasudevan comes in today with chief complaint of Medical Management of Chronic Issues   Diagnosis and orders addressed:  1. Hyperlipidemia with target LDL less than 100 Low fat diet - atorvastatin (LIPITOR) 40 MG tablet; Take 1 tablet (40 mg total) by mouth daily.  Dispense: 90 tablet; Refill: 1 - CBC with Differential/Platelet - CMP14+EGFR - Lipid panel  2. Acquired hypothyroidism Labs pending - levothyroxine (SYNTHROID) 75 MCG tablet; Take 1 tablet (75 mcg total) by mouth daily before breakfast.  Dispense: 90 tablet; Refill: 1 - Thyroid Panel With TSH  3. Osteopenia of lumbar spine Weight bearing exercises Wants to wait till later in year to do dexascan  4. GAD (generalized anxiety disorder) Continue follow up with psych  5. Smoker Smoking cessation encouraged Refuses low dose CT scan.   Labs pending Health Maintenance reviewed Diet and exercise encouraged  Follow up plan: 6 months   Mary-Margaret Hassell Done, FNP

## 2020-07-23 NOTE — Patient Instructions (Signed)

## 2020-07-24 LAB — CBC WITH DIFFERENTIAL/PLATELET
Basophils Absolute: 0 10*3/uL (ref 0.0–0.2)
Basos: 0 %
EOS (ABSOLUTE): 0.1 10*3/uL (ref 0.0–0.4)
Eos: 1 %
Hematocrit: 39.4 % (ref 34.0–46.6)
Hemoglobin: 13.9 g/dL (ref 11.1–15.9)
Immature Grans (Abs): 0 10*3/uL (ref 0.0–0.1)
Immature Granulocytes: 0 %
Lymphocytes Absolute: 1.8 10*3/uL (ref 0.7–3.1)
Lymphs: 31 %
MCH: 33.3 pg — ABNORMAL HIGH (ref 26.6–33.0)
MCHC: 35.3 g/dL (ref 31.5–35.7)
MCV: 94 fL (ref 79–97)
Monocytes Absolute: 0.4 10*3/uL (ref 0.1–0.9)
Monocytes: 7 %
Neutrophils Absolute: 3.5 10*3/uL (ref 1.4–7.0)
Neutrophils: 61 %
Platelets: 226 10*3/uL (ref 150–450)
RBC: 4.18 x10E6/uL (ref 3.77–5.28)
RDW: 12.5 % (ref 11.7–15.4)
WBC: 5.8 10*3/uL (ref 3.4–10.8)

## 2020-07-24 LAB — CMP14+EGFR
ALT: 14 IU/L (ref 0–32)
AST: 21 IU/L (ref 0–40)
Albumin/Globulin Ratio: 2 (ref 1.2–2.2)
Albumin: 4.4 g/dL (ref 3.8–4.8)
Alkaline Phosphatase: 138 IU/L — ABNORMAL HIGH (ref 44–121)
BUN/Creatinine Ratio: 8 — ABNORMAL LOW (ref 12–28)
BUN: 7 mg/dL — ABNORMAL LOW (ref 8–27)
Bilirubin Total: 0.6 mg/dL (ref 0.0–1.2)
CO2: 26 mmol/L (ref 20–29)
Calcium: 9.6 mg/dL (ref 8.7–10.3)
Chloride: 100 mmol/L (ref 96–106)
Creatinine, Ser: 0.85 mg/dL (ref 0.57–1.00)
Globulin, Total: 2.2 g/dL (ref 1.5–4.5)
Glucose: 91 mg/dL (ref 65–99)
Potassium: 5.2 mmol/L (ref 3.5–5.2)
Sodium: 138 mmol/L (ref 134–144)
Total Protein: 6.6 g/dL (ref 6.0–8.5)
eGFR: 77 mL/min/{1.73_m2} (ref >59)

## 2020-07-24 LAB — THYROID PANEL WITH TSH
Free Thyroxine Index: 2.2 (ref 1.2–4.9)
T3 Uptake Ratio: 27 % (ref 24–39)
T4, Total: 8.1 ug/dL (ref 4.5–12.0)
TSH: 1.9 u[IU]/mL (ref 0.450–4.500)

## 2020-07-24 LAB — LIPID PANEL
Chol/HDL Ratio: 3.2 ratio (ref 0.0–4.4)
Cholesterol, Total: 114 mg/dL (ref 100–199)
HDL: 36 mg/dL — ABNORMAL LOW (ref >39)
LDL Chol Calc (NIH): 59 mg/dL (ref 0–99)
Triglycerides: 98 mg/dL (ref 0–149)
VLDL Cholesterol Cal: 19 mg/dL (ref 5–40)

## 2020-07-29 ENCOUNTER — Other Ambulatory Visit: Payer: Self-pay | Admitting: Nurse Practitioner

## 2020-07-29 DIAGNOSIS — M25511 Pain in right shoulder: Secondary | ICD-10-CM

## 2020-10-25 ENCOUNTER — Other Ambulatory Visit: Payer: Self-pay | Admitting: Nurse Practitioner

## 2020-10-25 DIAGNOSIS — E039 Hypothyroidism, unspecified: Secondary | ICD-10-CM

## 2020-11-02 DIAGNOSIS — F411 Generalized anxiety disorder: Secondary | ICD-10-CM | POA: Diagnosis not present

## 2020-11-17 ENCOUNTER — Other Ambulatory Visit: Payer: Self-pay | Admitting: Nurse Practitioner

## 2020-11-17 DIAGNOSIS — M25511 Pain in right shoulder: Secondary | ICD-10-CM

## 2020-12-24 ENCOUNTER — Other Ambulatory Visit: Payer: Self-pay | Admitting: Nurse Practitioner

## 2020-12-24 DIAGNOSIS — M25511 Pain in right shoulder: Secondary | ICD-10-CM

## 2021-01-12 ENCOUNTER — Other Ambulatory Visit: Payer: Self-pay | Admitting: Nurse Practitioner

## 2021-01-12 DIAGNOSIS — E785 Hyperlipidemia, unspecified: Secondary | ICD-10-CM

## 2021-01-19 ENCOUNTER — Other Ambulatory Visit: Payer: Self-pay | Admitting: Nurse Practitioner

## 2021-01-19 DIAGNOSIS — M25511 Pain in right shoulder: Secondary | ICD-10-CM

## 2021-01-25 ENCOUNTER — Ambulatory Visit: Payer: Medicaid Other | Admitting: Nurse Practitioner

## 2021-01-25 ENCOUNTER — Encounter: Payer: Self-pay | Admitting: Nurse Practitioner

## 2021-01-25 VITALS — BP 140/83 | HR 96 | Temp 98.1°F | Resp 20 | Ht 63.0 in | Wt 124.0 lb

## 2021-01-25 DIAGNOSIS — M8588 Other specified disorders of bone density and structure, other site: Secondary | ICD-10-CM | POA: Diagnosis not present

## 2021-01-25 DIAGNOSIS — E785 Hyperlipidemia, unspecified: Secondary | ICD-10-CM | POA: Diagnosis not present

## 2021-01-25 DIAGNOSIS — E039 Hypothyroidism, unspecified: Secondary | ICD-10-CM

## 2021-01-25 DIAGNOSIS — F411 Generalized anxiety disorder: Secondary | ICD-10-CM

## 2021-01-25 DIAGNOSIS — F17200 Nicotine dependence, unspecified, uncomplicated: Secondary | ICD-10-CM

## 2021-01-25 NOTE — Progress Notes (Signed)
Subjective:    Patient ID: Jennifer Jennifer [name], female    DOB: 08/13/1960, 61 y.o.   MRN: [id number]  Chief Complaint: Medical Management of Chronic Issues    HPI:  Jennifer Jennifer [name] is a 61 y.o. who identifies as a female who was assigned female at birth.   Social history: Lives with: husband Work history: retired   Water engineer in today for follow up of the following chronic medical issues:  1. Hyperlipidemia with target LDL less than 100 Does not really watch diet and does no dedicated exercise Lab Results  Component Value Date   CHOL 114 07/23/2020   HDL 36 (L) 07/23/2020   LDLCALC 59 07/23/2020   TRIG 98 07/23/2020   CHOLHDL 3.2 07/23/2020  The ASCVD Risk score (Arnett DK, et al., 2019) failed to calculate for the following reasons:   The valid total cholesterol range is 130 to 320 mg/dL    2. Acquired hypothyroidism No problems that aware of. Lab Results  Component Value Date   TSH 1.900 07/23/2020     3. GAD (generalized anxiety disorder) Is on xanax as needed. She sees psych GAD 7 : Generalized Anxiety Score 01/25/2021 07/23/2020 07/22/2019 07/17/2016  Nervous, Anxious, on Edge 0 0 1 2  Control/stop worrying 0 0 1 3  Worry too much - different things 0 0 1 3  Trouble relaxing 0 0 0 2  Restless 0 0 0 1  Easily annoyed or irritable 0 0 0 1  Afraid - awful might happen 0 0 0 2  Total GAD 7 Score 0 0 3 14  Anxiety Difficulty Not difficult at all Not difficult at all Not difficult at all Very difficult      4. Osteopenia of lumbar spine Last dexascan was Jennifer [name] 02/07/17. Does no weight bearing exercise. She refuses to repeat today.  5. Smoker Still smokes over a pack a day. Declines low dose CT scan   New complaints: None today  Allergies  Allergen Reactions   Flagyl [Metronidazole]    Ivp Dye [Iodinated Contrast Media]    Penicillins    Outpatient Encounter Medications as of 01/25/2021  Medication Sig   ALPRAZolam (XANAX) 0.5 MG tablet Take 1 tablet (0.5 mg total)  by mouth 3 (three) times daily as needed for sleep.   atorvastatin (LIPITOR) 40 MG tablet Take 1 tablet (40 mg total) by mouth daily.   levothyroxine (SYNTHROID) 75 MCG tablet Take 1 tablet (75 mcg total) by mouth daily before breakfast.   naproxen (NAPROSYN) 500 MG tablet Take 1 tablet (500 mg total) by mouth 2 (two) times daily as needed. (NEEDS TO BE SEEN BEFORE NEXT REFILL)   No facility-administered encounter medications on file as of 01/25/2021.    Past Surgical History:  Procedure Laterality Date   LASER ABLATION OF THE CERVIX      Family History  Problem Relation Age of Onset   Stroke Mother    Kidney disease Mother    Hip fracture Mother    Emphysema Father    Cancer Father    Heart disease Sister    Healthy Brother    Thyroid disease Sister       Controlled substance contract: n/a     Review of Systems  Constitutional:  Negative for diaphoresis.  Eyes:  Negative for pain.  Respiratory:  Negative for shortness of breath.   Cardiovascular:  Negative for chest pain, palpitations and leg swelling.  Gastrointestinal:  Negative for abdominal pain.  Endocrine: Negative for polydipsia.  Skin:  Negative for rash.  Neurological:  Negative for dizziness, weakness and headaches.  Hematological:  Does not bruise/bleed easily.  All other systems reviewed and are negative.     Objective:   Physical Exam Vitals and nursing note reviewed.  Constitutional:      General: She is not in acute distress.    Appearance: Normal appearance. She is well-developed.  HENT:     Head: Normocephalic.     Right Ear: Tympanic membrane normal.     Left Ear: Tympanic membrane normal.     Nose: Nose normal.     Mouth/Throat:     Mouth: Mucous membranes are moist.  Eyes:     Pupils: Pupils are equal, round, and reactive to light.  Neck:     Vascular: No carotid bruit or JVD.  Cardiovascular:     Rate and Rhythm: Normal rate and regular rhythm.     Heart sounds: Normal heart sounds.   Pulmonary:     Effort: Pulmonary effort is normal. No respiratory distress.     Breath sounds: Normal breath sounds. No wheezing or rales.  Chest:     Chest wall: No tenderness.  Abdominal:     General: Bowel sounds are normal. There is no distension or abdominal bruit.     Palpations: Abdomen is soft. There is no hepatomegaly, splenomegaly, mass or pulsatile mass.     Tenderness: There is no abdominal tenderness.  Musculoskeletal:        General: Normal range of motion.     Cervical back: Normal range of motion and neck supple.  Lymphadenopathy:     Cervical: No cervical adenopathy.  Skin:    General: Skin is warm and dry.  Neurological:     Mental Status: She is alert and oriented to person, place, and time.     Deep Tendon Reflexes: Reflexes are normal and symmetric.  Psychiatric:        Behavior: Behavior normal.        Thought Content: Thought content normal.        Judgment: Judgment normal.    BP 140/83    Pulse 96    Temp 98.1 F (36.7 C) (Temporal)    Resp 20    Ht $R'5\' 3"'xp$  (1.6 m)    Wt 124 lb (56.2 kg)    SpO2 98%    BMI 21.97 kg/m        Assessment & Plan:  Jennifer Jennifer [name] comes in today with chief complaint of Medical Management of Chronic Issues   Diagnosis and orders addressed:  1. Hyperlipidemia with target LDL less than 100 Low fat diet - CBC with Differential/Platelet - CMP14+EGFR - Lipid panel  2. Acquired hypothyroidism Labs pending - Thyroid Panel With TSH  3. GAD (generalized anxiety disorder) Stress management  Keep psych follow up  4. Osteopenia of lumbar spine Will do dexascan at next visit  5. Smoker Smoking cessation encouraged Refuses low dose CT   Labs pending Health Maintenance reviewed Diet and exercise encouraged  Follow up plan: 3 months for physical   Jennifer Hassell Done, FNP

## 2021-01-25 NOTE — Patient Instructions (Signed)
Bone Health ?Bones protect organs, store calcium, anchor muscles, and support the whole body. Keeping your bones strong is important, especially as you get older. You can take actions to help keep your bones strong and healthy. ?Why is keeping my bones healthy important? ?Keeping your bones healthy is important because your body constantly replaces bone cells. Cells get old, and new cells take their place. As we age, we lose bone cells because the body may not be able to make enough new cells to replace the old cells. The amount of bone cells and bone tissue you have is referred to as bone mass. The higher your bone mass, the stronger your bones. ?The aging process leads to an overall loss of bone mass in the body, which can increase the likelihood of: ?Broken bones. ?A condition in which the bones become weak and brittle (osteoporosis). ?A large decline in bone mass occurs in older adults. In women, it occurs about the time of menopause. ?What actions can I take to keep my bones healthy? ?Good health habits are important for maintaining healthy bones. This includes eating nutritious foods and exercising regularly. To have healthy bones, you need to get enough of the right minerals and vitamins. Most nutrition experts recommend getting these nutrients from the foods that you eat. In some cases, taking supplements may also be recommended. Doing certain types of exercise is also important for bone health. ?What are the nutritional recommendations for healthy bones? ?Eating a well-balanced diet with plenty of calcium and vitamin D will help to protect your bones. Nutritional recommendations vary from person to person. Ask your health care provider what is healthy for you. Here are some general guidelines. ?Get enough calcium ?Calcium is the most important (essential) mineral for bone health. Most people can get enough calcium from their diet, but supplements may be recommended for people who are at risk for  osteoporosis. Good sources of calcium include: ?Dairy products, such as low-fat or nonfat milk, cheese, and yogurt. ?Dark green leafy vegetables, such as bok choy and broccoli. ?Foods that have calcium added to them (are fortified). Foods that may be fortified with calcium include orange juice, cereal, bread, soy beverages, and tofu products. ?Nuts, such as almonds. ?Follow these recommended amounts for daily calcium intake: ?Infants, 0-6 months: 200 mg. ?Infants, 6-12 months: 260 mg. ?Children, age 1-3: 700 mg. ?Children, age 4-8: 1,000 mg. ?Children, age 9-13: 1,300 mg. ?Teens, age 14-18: 1,300 mg. ?Adults, age 19-50: 1,000 mg. ?Adults, age 51-70: ?Men: 1,000 mg. ?Women: 1,200 mg. ?Adults, age 71 or older: 1,200 mg. ?Pregnant and breastfeeding females: ?Teens: 1,300 mg. ?Adults: 1,000 mg. ?Get enough vitamin D ?Vitamin D is the most essential vitamin for bone health. It helps the body absorb calcium. Sunlight stimulates the skin to make vitamin D, so be sure to get enough sunlight. If you live in a cold climate or you do not get outside often, your health care provider may recommend that you take vitamin D supplements. Good sources of vitamin D in your diet include: ?Egg yolks. ?Saltwater fish. ?Milk and cereal fortified with vitamin D. ?Follow these recommended amounts for daily vitamin D intake: ?Infants, 0-12 months: 400 international units (IU). ?Children and teens, age 1-18: 600 international units. ?Adults, age 59 or younger: 600 international units. ?Adults, age 60 or older: 600-1,000 international units. ?Get other important nutrients ?Other nutrients that are important for bone health include: ?Phosphorus. This mineral is found in meat, poultry, dairy foods, nuts, and legumes. The recommended daily   intake for adult men and adult women is 700 mg. ?Magnesium. This mineral is found in seeds, nuts, dark green vegetables, and legumes. The recommended daily intake for adult men is 400-420 mg. For adult women,  it is 310-320 mg. ?Vitamin K. This vitamin is found in green leafy vegetables. The recommended daily intake is 120 mcg for adult men and 90 mcg for adult women. ?What type of physical activity is best for building and maintaining healthy bones? ?Weight-bearing and strength-building activities are important for building and maintaining healthy bones. Weight-bearing activities cause muscles and bones to work against gravity. Strength-building activities increase the strength of the muscles that support bones. Weight-bearing and muscle-building activities include: ?Walking and hiking. ?Jogging and running. ?Dancing. ?Gym exercises. ?Lifting weights. ?Tennis and racquetball. ?Climbing stairs. ?Aerobics. ?Adults should get at least 30 minutes of moderate physical activity on most days. Children should get at least 60 minutes of moderate physical activity on most days. Ask your health care provider what type of exercise is best for you. ?How can I find out if my bone mass is low? ?Bone mass can be measured with an X-ray test called a bone mineral density (BMD) test. This test is recommended for all women who are age 65 or older. It may also be recommended for: ?Men who are age 70 or older. ?People who are at risk for osteoporosis because of: ?Having a long-term disease that weakens bones, such as kidney disease or rheumatoid arthritis. ?Having menopause earlier than normal. ?Taking medicine that weakens bones, such as steroids, thyroid hormones, or hormone treatment for breast cancer or prostate cancer. ?Smoking. ?Drinking three or more alcoholic drinks a day. ?Being underweight. ?Sedentary lifestyle. ?If you find that you have a low bone mass, you may be able to prevent osteoporosis or further bone loss by changing your diet and lifestyle. ?Where can I find more information? ?Bone Health & Osteoporosis Foundation: www.nof.org/patients ?National Institutes of Health: www.bones.nih.gov ?International Osteoporosis  Foundation: www.iofbonehealth.org ?Summary ?The aging process leads to an overall loss of bone mass in the body, which can increase the likelihood of broken bones and osteoporosis. ?Eating a well-balanced diet with plenty of calcium and vitamin D will help to protect your bones. ?Weight-bearing and strength-building activities are also important for building and maintaining strong bones. ?Bone mass can be measured with an X-ray test called a bone mineral density (BMD) test. ?This information is not intended to replace advice given to you by your health care provider. Make sure you discuss any questions you have with your health care provider. ?Document Revised: 06/23/2020 Document Reviewed: 06/23/2020 ?Elsevier Patient Education ? 2022 Elsevier Inc. ? ?

## 2021-01-26 LAB — CMP14+EGFR
ALT: 13 IU/L (ref 0–32)
AST: 25 IU/L (ref 0–40)
Albumin/Globulin Ratio: 1.9 (ref 1.2–2.2)
Albumin: 4.6 g/dL (ref 3.8–4.8)
Alkaline Phosphatase: 151 IU/L — ABNORMAL HIGH (ref 44–121)
BUN/Creatinine Ratio: 10 — ABNORMAL LOW (ref 12–28)
BUN: 8 mg/dL (ref 8–27)
Bilirubin Total: 0.7 mg/dL (ref 0.0–1.2)
CO2: 26 mmol/L (ref 20–29)
Calcium: 9.8 mg/dL (ref 8.7–10.3)
Chloride: 101 mmol/L (ref 96–106)
Creatinine, Ser: 0.8 mg/dL (ref 0.57–1.00)
Globulin, Total: 2.4 g/dL (ref 1.5–4.5)
Glucose: 92 mg/dL (ref 70–99)
Potassium: 4.8 mmol/L (ref 3.5–5.2)
Sodium: 138 mmol/L (ref 134–144)
Total Protein: 7 g/dL (ref 6.0–8.5)
eGFR: 83 mL/min/{1.73_m2} (ref >59)

## 2021-01-26 LAB — CBC WITH DIFFERENTIAL/PLATELET
Basophils Absolute: 0 10*3/uL (ref 0.0–0.2)
Basos: 1 %
EOS (ABSOLUTE): 0.3 10*3/uL (ref 0.0–0.4)
Eos: 4 %
Hematocrit: 41.8 % (ref 34.0–46.6)
Hemoglobin: 14.4 g/dL (ref 11.1–15.9)
Immature Grans (Abs): 0 10*3/uL (ref 0.0–0.1)
Immature Granulocytes: 0 %
Lymphocytes Absolute: 2 10*3/uL (ref 0.7–3.1)
Lymphs: 30 %
MCH: 32.7 pg (ref 26.6–33.0)
MCHC: 34.4 g/dL (ref 31.5–35.7)
MCV: 95 fL (ref 79–97)
Monocytes Absolute: 0.4 10*3/uL (ref 0.1–0.9)
Monocytes: 6 %
Neutrophils Absolute: 3.9 10*3/uL (ref 1.4–7.0)
Neutrophils: 59 %
Platelets: 203 10*3/uL (ref 150–450)
RBC: 4.4 x10E6/uL (ref 3.77–5.28)
RDW: 12.2 % (ref 11.7–15.4)
WBC: 6.7 10*3/uL (ref 3.4–10.8)

## 2021-01-26 LAB — LIPID PANEL
Chol/HDL Ratio: 3.6 ratio (ref 0.0–4.4)
Cholesterol, Total: 130 mg/dL (ref 100–199)
HDL: 36 mg/dL — ABNORMAL LOW (ref >39)
LDL Chol Calc (NIH): 71 mg/dL (ref 0–99)
Triglycerides: 129 mg/dL (ref 0–149)
VLDL Cholesterol Cal: 23 mg/dL (ref 5–40)

## 2021-01-26 LAB — THYROID PANEL WITH TSH
Free Thyroxine Index: 2.6 (ref 1.2–4.9)
T3 Uptake Ratio: 29 % (ref 24–39)
T4, Total: 9 ug/dL (ref 4.5–12.0)
TSH: 1.9 u[IU]/mL (ref 0.450–4.500)

## 2021-02-22 ENCOUNTER — Other Ambulatory Visit: Payer: Self-pay | Admitting: Nurse Practitioner

## 2021-02-22 DIAGNOSIS — M25511 Pain in right shoulder: Secondary | ICD-10-CM

## 2021-04-20 ENCOUNTER — Other Ambulatory Visit: Payer: Self-pay | Admitting: Nurse Practitioner

## 2021-04-20 DIAGNOSIS — E785 Hyperlipidemia, unspecified: Secondary | ICD-10-CM

## 2021-04-25 ENCOUNTER — Other Ambulatory Visit (HOSPITAL_COMMUNITY)
Admission: RE | Admit: 2021-04-25 | Discharge: 2021-04-25 | Disposition: A | Discharge status: Home / Self Care | Payer: Medicaid Other | Source: Ambulatory Visit | Attending: Nurse Practitioner | Admitting: Nurse Practitioner

## 2021-04-25 ENCOUNTER — Encounter: Payer: Self-pay | Admitting: Nurse Practitioner

## 2021-04-25 ENCOUNTER — Ambulatory Visit (INDEPENDENT_AMBULATORY_CARE_PROVIDER_SITE_OTHER): Payer: Medicaid Other | Admitting: Nurse Practitioner

## 2021-04-25 VITALS — BP 135/69 | HR 77 | Temp 98.2°F | Ht 63.0 in | Wt 121.0 lb

## 2021-04-25 DIAGNOSIS — Z0001 Encounter for general adult medical examination with abnormal findings: Secondary | ICD-10-CM

## 2021-04-25 DIAGNOSIS — E039 Hypothyroidism, unspecified: Secondary | ICD-10-CM | POA: Diagnosis not present

## 2021-04-25 DIAGNOSIS — E785 Hyperlipidemia, unspecified: Secondary | ICD-10-CM | POA: Diagnosis not present

## 2021-04-25 DIAGNOSIS — F411 Generalized anxiety disorder: Secondary | ICD-10-CM

## 2021-04-25 DIAGNOSIS — Z0000 Encounter for general adult medical examination without abnormal findings: Secondary | ICD-10-CM | POA: Insufficient documentation

## 2021-04-25 DIAGNOSIS — M8588 Other specified disorders of bone density and structure, other site: Secondary | ICD-10-CM

## 2021-04-25 DIAGNOSIS — F17200 Nicotine dependence, unspecified, uncomplicated: Secondary | ICD-10-CM

## 2021-04-25 MED ORDER — LEVOTHYROXINE SODIUM 75 MCG PO TABS: 75.0000 ug | ORAL_TABLET | Freq: Every day | ORAL | 1 refills | Status: DC

## 2021-04-25 MED ORDER — ATORVASTATIN CALCIUM 40 MG PO TABS: 40.0000 mg | ORAL_TABLET | Freq: Every day | ORAL | 1 refills | Status: DC

## 2021-04-25 NOTE — Progress Notes (Signed)
? ?Subjective:  ? ? Patient ID: Jennifer [name], female    DOB: 10-30-1960, 61 y.o.   MRN: [id number] ? ? ?Chief Complaint: medical management of chronic issues  ?  ? ?HPI: ? ?Jennifer [name] is a 61 y.o. who identifies as a female who was assigned female at birth.  ? ?Social history: ?Lives with: her husband ?Work history: she does nno work ? ? ?Comes in today for follow up of the following chronic medical issues: ? ?1. Annual physical exam ?Will be doing pap today.  ? ?2. Hyperlipidemia with target LDL less than 100 ?Doe stry to [facility] diet but does no dedicated exercise. ?Lab Results  ?Component Value Date  ? CHOL 130 01/25/2021  ? HDL 36 (L) 01/25/2021  ? LDLCALC 71 01/25/2021  ? TRIG 129 01/25/2021  ? CHOLHDL 3.6 01/25/2021  ? ?The 10-year ASCVD risk score (Arnett DK, et al., 2019) is: 5.1% ? ? ? ?3. Acquired hypothyroidism ?No problems that she is aware of. ?Lab Results  ?Component Value Date  ? TSH 1.900 01/25/2021  ? ? ? ?4. GAD (generalized anxiety disorder) ?Is on xanax daily and is doing well. ? ?5. Osteopenia of lumbar spine ?Last dexascan was done with t score of -2.3. she does no dedicated exercises ? ?6. Smoker ?Still smokes about 1 pack a day. ? ? ?New complaints: ?None today ? ?Allergies  ?Allergen Reactions  ? Flagyl [Metronidazole]   ? Ivp Dye [Iodinated Contrast Media]   ? Penicillins   ? ?Outpatient Encounter Medications as of 04/25/2021  ?Medication Sig  ? ALPRAZolam (XANAX) 0.5 MG tablet Take 1 tablet (0.5 mg total) by mouth 3 (three) times daily as needed for sleep.  ? atorvastatin (LIPITOR) 40 MG tablet Take 1 tablet (40 mg total) by mouth daily.  ? levothyroxine (SYNTHROID) 75 MCG tablet Take 1 tablet (75 mcg total) by mouth daily before breakfast.  ? naproxen (NAPROSYN) 500 MG tablet Take 1 tablet (500 mg total) by mouth 2 (two) times daily as needed.  ? ?No facility-administered encounter medications on file as of 04/25/2021.  ? ? ?Past Surgical History:  ?Procedure Laterality Date  ? LASER  ABLATION OF THE CERVIX    ? ? ?Family History  ?Problem Relation Age of Onset  ? Stroke Mother   ? Kidney disease Mother   ? Hip fracture Mother   ? Emphysema Father   ? Cancer Father   ? Heart disease Sister   ? Healthy Brother   ? Thyroid disease Sister   ? ? ? ? ?Controlled substance contract: n/a ? ? ? ? ?Review of Systems  ?Constitutional:  Negative for diaphoresis.  ?Eyes:  Negative for pain.  ?Respiratory:  Negative for shortness of breath.   ?Cardiovascular:  Negative for chest pain, palpitations and leg swelling.  ?Gastrointestinal:  Negative for abdominal pain.  ?Endocrine: Negative for polydipsia.  ?Skin:  Negative for rash.  ?Neurological:  Negative for dizziness, weakness and headaches.  ?Hematological:  Does not bruise/bleed easily.  ?All other systems reviewed and are negative. ? ?   ?Objective:  ? Physical Exam ?Vitals and nursing note reviewed.  ?Constitutional:   ?   General: She is not in acute distress. ?   Appearance: Normal appearance. She is well-developed.  ?HENT:  ?   Head: Normocephalic.  ?   Right Ear: Tympanic membrane normal.  ?   Left Ear: Tympanic membrane normal.  ?   Nose: Nose normal.  ?   Mouth/Throat:  ?  Mouth: Mucous membranes are moist.  ?Eyes:  ?   Pupils: Pupils are equal, round, and reactive to light.  ?Neck:  ?   Vascular: No carotid bruit or JVD.  ?Cardiovascular:  ?   Rate and Rhythm: Normal rate and regular rhythm.  ?   Heart sounds: Normal heart sounds.  ?Pulmonary:  ?   Effort: Pulmonary effort is normal. No respiratory distress.  ?   Breath sounds: Normal breath sounds. No wheezing or rales.  ?Chest:  ?   Chest wall: No tenderness.  ?Abdominal:  ?   General: Bowel sounds are normal. There is no distension or abdominal bruit.  ?   Palpations: Abdomen is soft. There is no hepatomegaly, splenomegaly, mass or pulsatile mass.  ?   Tenderness: There is no abdominal tenderness.  ?Genitourinary: ?   General: Normal vulva.  ?   Vagina: No vaginal discharge.  ?   Rectum:  Normal.  ?   Comments: Cervix parous and pink ?No adnexal masses or tenderness ?Musculoskeletal:     ?   General: Normal range of motion.  ?   Cervical back: Normal range of motion and neck supple.  ?Lymphadenopathy:  ?   Cervical: No cervical adenopathy.  ?Skin: ?   General: Skin is warm and dry.  ?Neurological:  ?   Mental Status: She is alert and oriented to person, place, and time.  ?   Deep Tendon Reflexes: Reflexes are normal and symmetric.  ?Psychiatric:     ?   Behavior: Behavior normal.     ?   Thought Content: Thought content normal.     ?   Judgment: Judgment normal.  ? ? ?BP 135/69   Pulse 77   Temp 98.2 ?F (36.8 ?C)   Ht 5\' 3"  (1.6 m)   Wt 121 lb (54.9 kg)   SpO2 96%   BMI 21.43 kg/m?  ? ? ? ? ?   ?Assessment & Plan:  ?Jennifer [name] comes in today with chief complaint of Annual Exam ? ? ?Diagnosis and orders addressed: ? ?1. Annual physical exam ?- Cytology - PAP ? ?2. Hyperlipidemia with target LDL less than 100 ?Low fat diet ?- atorvastatin (LIPITOR) 40 MG tablet; Take 1 tablet (40 mg total) by mouth daily.  Dispense: 90 tablet; Refill: 1 ? ?3. Acquired hypothyroidism ? ?- levothyroxine (SYNTHROID) 75 MCG tablet; Take 1 tablet (75 mcg total) by mouth daily before breakfast.  Dispense: 90 tablet; Refill: 1 ? ?4. GAD (generalized anxiety disorder) ?Stress management ? ?5. Osteopenia of lumbar spine ?Will do dexscan today ? ?6. Smoker ?Smoking cessation encouraged ? ? ?Labs pending ?Health Maintenance reviewed ?Diet and exercise encouraged ? ?Follow up plan: ?6 months ? ? ?Mary-Margaret Orlan Leavens, FNP ? ? ?

## 2021-05-03 LAB — CYTOLOGY - PAP
Diagnosis: NEGATIVE
Diagnosis: REACTIVE

## 2021-05-18 ENCOUNTER — Other Ambulatory Visit: Payer: Self-pay | Admitting: Nurse Practitioner

## 2021-05-18 DIAGNOSIS — M25511 Pain in right shoulder: Secondary | ICD-10-CM

## 2021-06-15 ENCOUNTER — Other Ambulatory Visit: Payer: Self-pay | Admitting: Nurse Practitioner

## 2021-06-15 DIAGNOSIS — M25511 Pain in right shoulder: Secondary | ICD-10-CM

## 2021-07-12 ENCOUNTER — Other Ambulatory Visit: Payer: Self-pay | Admitting: Nurse Practitioner

## 2021-07-12 DIAGNOSIS — E039 Hypothyroidism, unspecified: Secondary | ICD-10-CM

## 2021-07-19 ENCOUNTER — Other Ambulatory Visit: Payer: Self-pay | Admitting: Nurse Practitioner

## 2021-07-19 DIAGNOSIS — E785 Hyperlipidemia, unspecified: Secondary | ICD-10-CM

## 2021-07-27 NOTE — Patient Instructions (Signed)
Our records indicate that you are due for your annual mammogram/breast imaging. While there is no way to prevent breast cancer, early detection provides the best opportunity for curing it. For women over the age of 40, the American Cancer Society recommends a yearly clinical breast exam and a yearly mammogram. These practices have saved thousands of lives. We need your help to ensure that you are receiving optimal medical care. Please call the imaging location that has done you previous mammograms. Please remember to list us as your primary care. This helps make sure we receive a report and can update your chart.  Below is the contact information for several local breast imaging centers. You may call the location that works best for you, and they will be happy to assistance in making you an appointment. You do not need an order for a regular screening mammogram. However, if you are having any problems or concerns with you breast area, please let your primary care provider know, and appropriate orders will be placed. Please let our office know if you have any questions or concerns. Or if you need information for another imaging center not on this list or outside of the area. We are commented to working with you on your health care journey.   The mobile unit/bus ([facility]) - they come twice a month to our location.  These appointments can be made through our office or by call The Breast Center  [facility]  [address] [place], [place] [address] Phone [phone]  [facility] Radiology Department  [address]  [place], [place] [address] [phone]  [facility])  [address] [place], [place] [address] [phone]  [facility] [place]  [address] [place] [address] [phone]  [facility] [place]  [address], [address] [place] [address] [phone]  [facility] [place]  [address] , [place] [address] [phone]  [facility] [address] [place], [place] [address] [phone]  [facility] [address]  [address] , [place] [address] [phone]  [facility] [address] [place] [address] [phone]     

## 2021-07-28 ENCOUNTER — Ambulatory Visit (INDEPENDENT_AMBULATORY_CARE_PROVIDER_SITE_OTHER): Payer: Medicaid Other

## 2021-07-28 ENCOUNTER — Ambulatory Visit: Payer: Medicaid Other | Admitting: Nurse Practitioner

## 2021-07-28 ENCOUNTER — Encounter: Payer: Self-pay | Admitting: Nurse Practitioner

## 2021-07-28 VITALS — BP 137/84 | HR 89 | Temp 98.4°F | Resp 20 | Ht 63.0 in | Wt 117.0 lb

## 2021-07-28 DIAGNOSIS — E785 Hyperlipidemia, unspecified: Secondary | ICD-10-CM | POA: Diagnosis not present

## 2021-07-28 DIAGNOSIS — E039 Hypothyroidism, unspecified: Secondary | ICD-10-CM | POA: Diagnosis not present

## 2021-07-28 DIAGNOSIS — M8588 Other specified disorders of bone density and structure, other site: Secondary | ICD-10-CM

## 2021-07-28 DIAGNOSIS — F17200 Nicotine dependence, unspecified, uncomplicated: Secondary | ICD-10-CM

## 2021-07-28 DIAGNOSIS — F411 Generalized anxiety disorder: Secondary | ICD-10-CM | POA: Diagnosis not present

## 2021-07-28 MED ORDER — LEVOTHYROXINE SODIUM 75 MCG PO TABS: 75.0000 ug | ORAL_TABLET | Freq: Every day | ORAL | 1 refills | Status: DC

## 2021-07-28 MED ORDER — ATORVASTATIN CALCIUM 40 MG PO TABS: 40.0000 mg | ORAL_TABLET | Freq: Every day | ORAL | 1 refills | Status: DC

## 2021-07-28 NOTE — Progress Notes (Signed)
Subjective:    Patient ID: Jennifer [name], female    DOB: 08-Nov-1960, 61 y.o.   MRN: [id number]   Chief Complaint: medical management of chronic issues     HPI:  Jennifer [name] is a 61 y.o. who identifies as a female who was assigned female at birth.   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 Hyperlipidemia with target LDL less than 100 Doe stry to [facility]c diet but does no dedicated exercise. Lab Results  Component Value Date   CHOL 130 01/25/2021   HDL 36 (L) 01/25/2021   LDLCALC 71 01/25/2021   TRIG 129 01/25/2021   CHOLHDL 3.6 01/25/2021   The 10-year ASCVD risk score (Arnett DK, et al., 2019) is: 4.9%   2. Acquired hypothyroidism No issues that she is aware of. Lab Results  Component Value Date   TSH 1.900 01/25/2021     3. GAD (generalized anxiety disorder) Is on no prescription meds. Is doing well right now. Refused to fill out questionaire today.    04/25/2021    9:20 AM 01/25/2021   10:05 AM 07/23/2020   10:22 AM 07/22/2019   10:18 AM  GAD 7 : Generalized Anxiety Score  Nervous, Anxious, on Edge 2 0 0 1  Control/stop worrying 1 0 0 1  Worry too much - different things 1 0 0 1  Trouble relaxing 0 0 0 0  Restless 0 0 0 0  Easily annoyed or irritable 0 0 0 0  Afraid - awful might happen 0 0 0 0  Total GAD 7 Score 4 0 0 3  Anxiety Difficulty Not difficult at all Not difficult at all Not difficult at all Not difficult at all      4. Osteopenia of lumbar spine Last dexascan was done on 02/07/17. Her t score was -2.3. She does no weight bearing exercises.  5. Smoker Smoke over  pack a day.    New complaints: None today  Allergies  Allergen Reactions   Flagyl [Metronidazole]    Ivp Dye [Iodinated Contrast Media]    Penicillins    Outpatient Encounter Medications as of 07/28/2021  Medication Sig   atorvastatin (LIPITOR) 40 MG tablet Take 1 tablet (40 mg  total) by mouth daily.   levothyroxine (SYNTHROID) 75 MCG tablet Take 1 tablet (75 mcg total) by mouth daily before breakfast.   naproxen (NAPROSYN) 500 MG tablet TAKE 1 TABLET BY MOUTH TWICE DAILY AS NEEDED   No facility-administered encounter medications on file as of 07/28/2021.    Past Surgical History:  Procedure Laterality Date   LASER ABLATION OF THE CERVIX      Family History  Problem Relation Age of Onset   Stroke Mother    Kidney disease Mother    Hip fracture Mother    Emphysema Father    Cancer Father    Heart disease Sister    Healthy Brother    Thyroid disease Sister       Controlled substance contract: n/a     Review of Systems  Constitutional:  Negative for diaphoresis.  Eyes:  Negative for pain.  Respiratory:  Negative for shortness of breath.   Cardiovascular:  Negative for chest pain, palpitations and leg swelling.  Gastrointestinal:  Negative for abdominal pain.  Endocrine: Negative for polydipsia.  Skin:  Negative for rash.  Neurological:  Negative for dizziness, weakness and headaches.  Hematological:  Does not bruise/bleed easily.  All other systems reviewed and are negative.      Objective:   Physical Exam Vitals and nursing note reviewed.  Constitutional:      General: She is not in acute distress.    Appearance: Normal appearance. She is well-developed.  HENT:     Head: Normocephalic.     Right Ear: Tympanic membrane normal.     Left Ear: Tympanic membrane normal.     Nose: Nose normal.     Mouth/Throat:     Mouth: Mucous membranes are moist.  Eyes:     Pupils: Pupils are equal, round, and reactive to light.  Neck:     Vascular: No carotid bruit or JVD.  Cardiovascular:     Rate and Rhythm: Normal rate and regular rhythm.     Heart sounds: Normal heart sounds.  Pulmonary:     Effort: Pulmonary effort is normal. No respiratory distress.     Breath sounds: Normal breath sounds. No wheezing or rales.  Chest:     Chest wall: No  tenderness.  Abdominal:     General: Bowel sounds are normal. There is no distension or abdominal bruit.     Palpations: Abdomen is soft. There is no hepatomegaly, splenomegaly, mass or pulsatile mass.     Tenderness: There is no abdominal tenderness.  Musculoskeletal:        General: Normal range of motion.     Cervical back: Normal range of motion and neck supple.  Lymphadenopathy:     Cervical: No cervical adenopathy.  Skin:    General: Skin is warm and dry.  Neurological:     Mental Status: She is alert and oriented to person, place, and time.     Deep Tendon Reflexes: Reflexes are normal and symmetric.  Psychiatric:        Behavior: Behavior normal.        Thought Content: Thought content normal.        Judgment: Judgment normal.     BP 137/84   Pulse 89   Temp 98.4 F (36.9 C) (Temporal)   Resp 20   Ht $R'5\' 3"'dk$  (1.6 m)   Wt 117 lb (53.1 kg)   SpO2 94%   BMI 20.73 kg/m        Assessment & Plan:   Jennifer [name] comes in today with chief complaint of Medical Management of Chronic Issues   Diagnosis and orders addressed:  1. Hyperlipidemia with target LDL less than 100 Low fat diet - atorvastatin (LIPITOR) 40 MG tablet; Take 1 tablet (40 mg total) by mouth daily.  Dispense: 90 tablet; Refill: 1 - CBC with Differential/Platelet - CMP14+EGFR - Lipid panel  2. Acquired hypothyroidism Labs pending - levothyroxine (SYNTHROID) 75 MCG tablet; Take 1 tablet (75 mcg total) by mouth daily before breakfast.  Dispense: 90 tablet; Refill: 1 - Thyroid Panel With TSH  3. GAD (generalized anxiety disorder) Stress management  4. Osteopenia of lumbar spine Weight bearing exercises  5. Smoker Smoking cessation encouraged   Labs pending Health Maintenance reviewed Diet and exercise encouraged  Follow up plan: 6 months   Mary-Margaret Hassell Done, FNP

## 2021-07-29 DIAGNOSIS — M85852 Other specified disorders of bone density and structure, left thigh: Secondary | ICD-10-CM | POA: Diagnosis not present

## 2021-07-29 DIAGNOSIS — M85851 Other specified disorders of bone density and structure, right thigh: Secondary | ICD-10-CM | POA: Diagnosis not present

## 2021-07-29 DIAGNOSIS — M810 Age-related osteoporosis without current pathological fracture: Secondary | ICD-10-CM | POA: Diagnosis not present

## 2021-07-29 LAB — CBC WITH DIFFERENTIAL/PLATELET
Basophils Absolute: 0 10*3/uL (ref 0.0–0.2)
Basos: 1 %
EOS (ABSOLUTE): 0 10*3/uL (ref 0.0–0.4)
Eos: 1 %
Hematocrit: 40.1 % (ref 34.0–46.6)
Hemoglobin: 13.9 g/dL (ref 11.1–15.9)
Immature Grans (Abs): 0 10*3/uL (ref 0.0–0.1)
Immature Granulocytes: 0 %
Lymphocytes Absolute: 1.5 10*3/uL (ref 0.7–3.1)
Lymphs: 31 %
MCH: 34.2 pg — ABNORMAL HIGH (ref 26.6–33.0)
MCHC: 34.7 g/dL (ref 31.5–35.7)
MCV: 99 fL — ABNORMAL HIGH (ref 79–97)
Monocytes Absolute: 0.3 10*3/uL (ref 0.1–0.9)
Monocytes: 6 %
Neutrophils Absolute: 2.9 10*3/uL (ref 1.4–7.0)
Neutrophils: 61 %
Platelets: 160 10*3/uL (ref 150–450)
RBC: 4.07 x10E6/uL (ref 3.77–5.28)
RDW: 12.5 % (ref 11.7–15.4)
WBC: 4.8 10*3/uL (ref 3.4–10.8)

## 2021-07-29 LAB — CMP14+EGFR
ALT: 9 IU/L (ref 0–32)
AST: 22 IU/L (ref 0–40)
Albumin/Globulin Ratio: 2 (ref 1.2–2.2)
Albumin: 4.3 g/dL (ref 3.8–4.8)
Alkaline Phosphatase: 152 IU/L — ABNORMAL HIGH (ref 44–121)
BUN/Creatinine Ratio: 8 — ABNORMAL LOW (ref 12–28)
BUN: 6 mg/dL — ABNORMAL LOW (ref 8–27)
Bilirubin Total: 0.8 mg/dL (ref 0.0–1.2)
CO2: 25 mmol/L (ref 20–29)
Calcium: 9.4 mg/dL (ref 8.7–10.3)
Chloride: 101 mmol/L (ref 96–106)
Creatinine, Ser: 0.79 mg/dL (ref 0.57–1.00)
Globulin, Total: 2.1 g/dL (ref 1.5–4.5)
Glucose: 83 mg/dL (ref 70–99)
Potassium: 3.8 mmol/L (ref 3.5–5.2)
Sodium: 139 mmol/L (ref 134–144)
Total Protein: 6.4 g/dL (ref 6.0–8.5)
eGFR: 84 mL/min/{1.73_m2} (ref >59)

## 2021-07-29 LAB — LIPID PANEL
Chol/HDL Ratio: 3 ratio (ref 0.0–4.4)
Cholesterol, Total: 108 mg/dL (ref 100–199)
HDL: 36 mg/dL — ABNORMAL LOW (ref >39)
LDL Chol Calc (NIH): 54 mg/dL (ref 0–99)
Triglycerides: 92 mg/dL (ref 0–149)
VLDL Cholesterol Cal: 18 mg/dL (ref 5–40)

## 2021-07-29 LAB — THYROID PANEL WITH TSH
Free Thyroxine Index: 3.2 (ref 1.2–4.9)
T3 Uptake Ratio: 31 % (ref 24–39)
T4, Total: 10.2 ug/dL (ref 4.5–12.0)
TSH: 0.947 u[IU]/mL (ref 0.450–4.500)

## 2021-08-08 DIAGNOSIS — F410 Panic disorder [episodic paroxysmal anxiety] without agoraphobia: Secondary | ICD-10-CM | POA: Diagnosis not present

## 2021-08-30 ENCOUNTER — Ambulatory Visit: Payer: Medicaid Other | Admitting: Pharmacist

## 2021-11-07 ENCOUNTER — Ambulatory Visit: Payer: Medicaid Other | Admitting: Nurse Practitioner

## 2021-12-12 DIAGNOSIS — F411 Generalized anxiety disorder: Secondary | ICD-10-CM | POA: Diagnosis not present

## 2022-01-30 ENCOUNTER — Ambulatory Visit: Payer: Medicaid Other | Admitting: Nurse Practitioner

## 2022-02-03 ENCOUNTER — Ambulatory Visit: Payer: Medicaid Other | Admitting: Nurse Practitioner

## 2022-02-07 ENCOUNTER — Ambulatory Visit: Payer: Medicaid Other | Admitting: Nurse Practitioner

## 2022-02-08 ENCOUNTER — Encounter: Payer: Self-pay | Admitting: Nurse Practitioner

## 2022-02-16 ENCOUNTER — Encounter: Payer: Self-pay | Admitting: Nurse Practitioner

## 2022-02-16 ENCOUNTER — Ambulatory Visit: Payer: Medicaid Other | Admitting: Nurse Practitioner

## 2022-02-16 VITALS — BP 114/69 | HR 93 | Temp 97.8°F | Resp 20 | Ht 63.0 in | Wt 110.0 lb

## 2022-02-16 DIAGNOSIS — F17200 Nicotine dependence, unspecified, uncomplicated: Secondary | ICD-10-CM

## 2022-02-16 DIAGNOSIS — M816 Localized osteoporosis [Lequesne]: Secondary | ICD-10-CM

## 2022-02-16 DIAGNOSIS — F411 Generalized anxiety disorder: Secondary | ICD-10-CM | POA: Diagnosis not present

## 2022-02-16 DIAGNOSIS — E039 Hypothyroidism, unspecified: Secondary | ICD-10-CM

## 2022-02-16 DIAGNOSIS — E785 Hyperlipidemia, unspecified: Secondary | ICD-10-CM | POA: Diagnosis not present

## 2022-02-16 MED ORDER — ALENDRONATE SODIUM 70 MG PO TABS: 70.0000 mg | ORAL_TABLET | ORAL | 11 refills | Status: DC

## 2022-02-16 MED ORDER — LEVOTHYROXINE SODIUM 75 MCG PO TABS: 75.0000 ug | ORAL_TABLET | Freq: Every day | ORAL | 1 refills | Status: DC

## 2022-02-16 MED ORDER — ATORVASTATIN CALCIUM 40 MG PO TABS: 40.0000 mg | ORAL_TABLET | Freq: Every day | ORAL | 1 refills | Status: DC

## 2022-02-16 NOTE — Patient Instructions (Signed)
Bone Health Bones protect organs, store calcium, anchor muscles, and support the whole body. Keeping your bones strong is important, especially as you get older. You can take actions to help keep your bones strong and healthy. Why is keeping my bones healthy important?  Keeping your bones healthy is important because your body constantly replaces bone cells. Cells get old, and new cells take their place. As we age, we lose bone cells because the body may not be able to make enough new cells to replace the old cells. The amount of bone cells and bone tissue you have is referred to as bone mass. The higher your bone mass, the stronger your bones. The aging process leads to an overall loss of bone mass in the body, which can increase the likelihood of: Broken bones. A condition in which the bones become weak and brittle (osteoporosis). A large decline in bone mass occurs in older adults. In women, it occurs about the time of menopause. What actions can I take to keep my bones healthy? Good health habits are important for maintaining healthy bones. This includes eating nutritious foods and exercising regularly. To have healthy bones, you need to get enough of the right minerals and vitamins. Most nutrition experts recommend getting these nutrients from the foods that you eat. In some cases, taking supplements may also be recommended. Doing certain types of exercise is also important for bone health. What are the nutritional recommendations for healthy bones?  Eating a well-balanced diet with plenty of calcium and vitamin D will help to protect your bones. Nutritional recommendations vary from person to person. Ask your health care provider what is healthy for you. Here are some general guidelines. Get enough calcium Calcium is the most important (essential) mineral for bone health. Most people can get enough calcium from their diet, but supplements may be recommended for people who are at risk for  osteoporosis. Good sources of calcium include: Dairy products, such as low-fat or nonfat milk, cheese, and yogurt. Dark green leafy vegetables, such as bok choy and broccoli. Foods that have calcium added to them (are fortified). Foods that may be fortified with calcium include orange juice, cereal, bread, soy beverages, and tofu products. Nuts, such as almonds. Follow these recommended amounts for daily calcium intake: Infants, 0-6 months: 200 mg. Infants, 6-12 months: 260 mg. Children, age 1-3: 700 mg. Children, age 4-8: 1,000 mg. Children, age 9-13: 1,300 mg. Teens, age 14-18: 1,300 mg. Adults, age 19-50: 1,000 mg. Adults, age 51-70: Men: 1,000 mg. Women: 1,200 mg. Adults, age 71 or older: 1,200 mg. Pregnant and breastfeeding females: Teens: 1,300 mg. Adults: 1,000 mg. Get enough vitamin D Vitamin D is the most essential vitamin for bone health. It helps the body absorb calcium. Sunlight stimulates the skin to make vitamin D, so be sure to get enough sunlight. If you live in a cold climate or you do not get outside often, your health care provider may recommend that you take vitamin D supplements. Good sources of vitamin D in your diet include: Egg yolks. Saltwater fish. Milk and cereal fortified with vitamin D. Follow these recommended amounts for daily vitamin D intake: Infants, 0-12 months: 400 international units (IU). Children and teens, age 1-18: 600 international units. Adults, age 59 or younger: 600 international units. Adults, age 60 or older: 600-1,000 international units. Get other important nutrients Other nutrients that are important for bone health include: Phosphorus. This mineral is found in meat, poultry, dairy foods, nuts, and legumes. The   recommended daily intake for adult men and adult women is 700 mg. Magnesium. This mineral is found in seeds, nuts, dark green vegetables, and legumes. The recommended daily intake for adult men is 400-420 mg. For adult women,  it is 310-320 mg. Vitamin K. This vitamin is found in green leafy vegetables. The recommended daily intake is 120 mcg for adult men and 90 mcg for adult women. What type of physical activity is best for building and maintaining healthy bones? Weight-bearing and strength-building activities are important for building and maintaining healthy bones. Weight-bearing activities cause muscles and bones to work against gravity. Strength-building activities increase the strength of the muscles that support bones. Weight-bearing and muscle-building activities include: Walking and hiking. Jogging and running. Dancing. Gym exercises. Lifting weights. Tennis and racquetball. Climbing stairs. Aerobics. Adults should get at least 30 minutes of moderate physical activity on most days. Children should get at least 60 minutes of moderate physical activity on most days. Ask your health care provider what type of exercise is best for you. How can I find out if my bone mass is low? Bone mass can be measured with an X-ray test called a bone mineral density (BMD) test. This test is recommended for all women who are age 65 or older. It may also be recommended for: Men who are age 70 or older. People who are at risk for osteoporosis because of: Having a long-term disease that weakens bones, such as kidney disease or rheumatoid arthritis. Having menopause earlier than normal. Taking medicine that weakens bones, such as steroids, thyroid hormones, or hormone treatment for breast cancer or prostate cancer. Smoking. Drinking three or more alcoholic drinks a day. Being underweight. Sedentary lifestyle. If you find that you have a low bone mass, you may be able to prevent osteoporosis or further bone loss by changing your diet and lifestyle. Where can I find more information? Bone Health & Osteoporosis Foundation: www.nof.org/patients National Institutes of Health: www.bones.nih.gov International Osteoporosis  Foundation: www.iofbonehealth.org Summary The aging process leads to an overall loss of bone mass in the body, which can increase the likelihood of broken bones and osteoporosis. Eating a well-balanced diet with plenty of calcium and vitamin D will help to protect your bones. Weight-bearing and strength-building activities are also important for building and maintaining strong bones. Bone mass can be measured with an X-ray test called a bone mineral density (BMD) test. This information is not intended to replace advice given to you by your health care provider. Make sure you discuss any questions you have with your health care provider. Document Revised: 06/23/2020 Document Reviewed: 06/23/2020 Elsevier Patient Education  2023 Elsevier Inc.  

## 2022-02-16 NOTE — Progress Notes (Signed)
Subjective:    Patient ID: Jennifer [name], female    DOB: [birth year], 62 y.o.   MRN: [id number]   Chief Complaint: Medical Management of Chronic Issues    HPI:  Jennifer [name] is a 62 y.o. who identifies as a female who was assigned female at birth.   Social history: Lives with: husband Work history: disability   Comes in today for follow up of the following chronic medical issues:  1. Hyperlipidemia with target LDL less than 100 Low fat diet. Does no dedicated exercise. Lab Results  Component Value Date   CHOL 108 07/28/2021   HDL 36 (L) 07/28/2021   LDLCALC 54 07/28/2021   TRIG 92 07/28/2021   CHOLHDL 3.0 07/28/2021     2. Acquired hypothyroidism No issues that she is aware of Lab Results  Component Value Date   TSH 0.947 07/28/2021     3. GAD (generalized anxiety disorder) Is on xanax. Sees psych every 4 months.    02/16/2022   11:15 AM 04/25/2021    9:20 AM 01/25/2021   10:05 AM 07/23/2020   10:22 AM  GAD 7 : Generalized Anxiety Score  Nervous, Anxious, on Edge 0 2 0 0  Control/stop worrying 0 1 0 0  Worry too much - different things 0 1 0 0  Trouble relaxing 0 0 0 0  Restless 0 0 0 0  Easily annoyed or irritable 0 0 0 0  Afraid - awful might happen  0 0 0  Total GAD 7 Score  4 0 0  Anxiety Difficulty Not difficult at all Not difficult at all Not difficult at all Not difficult at all      4. Osteopenia of lumbar spine Last dexascan was done on 07/28/21. Her t score was -2.4. she says she has never taken anything for this. She was suppose to see clinical pharmacist, but patient cancelled appointment   5. Smoker Smokes over a pack a day.    New complaints: None today  Allergies  Allergen Reactions   Flagyl [Metronidazole]    Ivp Dye [Iodinated Contrast Media]    Penicillins    Outpatient Encounter Medications as of 02/16/2022  Medication Sig   ALPRAZolam (XANAX) 0.5 MG tablet Take 0.5 mg by mouth 4 (four) times daily as needed.   atorvastatin  (LIPITOR) 40 MG tablet Take 1 tablet (40 mg total) by mouth daily.   levothyroxine (SYNTHROID) 75 MCG tablet Take 1 tablet (75 mcg total) by mouth daily before breakfast.   [DISCONTINUED] naproxen (NAPROSYN) 500 MG tablet TAKE 1 TABLET BY MOUTH TWICE DAILY AS NEEDED (Patient not taking: Reported on 07/28/2021)   No facility-administered encounter medications on file as of 02/16/2022.    Past Surgical History:  Procedure Laterality Date   LASER ABLATION OF THE CERVIX      Family History  Problem Relation Age of Onset   Stroke Mother    Kidney disease Mother    Hip fracture Mother    Emphysema Father    Cancer Father    Heart disease Sister    Healthy Brother    Thyroid disease Sister       Controlled substance contract: n/a     Review of Systems  Constitutional:  Negative for diaphoresis.  Eyes:  Negative for pain.  Respiratory:  Negative for shortness of breath.   Cardiovascular:  Negative for chest pain, palpitations and leg swelling.  Gastrointestinal:  Negative for abdominal pain.  Endocrine: Negative for polydipsia.  Skin:  Negative  for rash.  Neurological:  Negative for dizziness, weakness and headaches.  Hematological:  Does not bruise/bleed easily.  All other systems reviewed and are negative.      Objective:   Physical Exam Vitals and nursing note reviewed.  Constitutional:      General: She is not in acute distress.    Appearance: Normal appearance. She is well-developed.  HENT:     Head: Normocephalic.     Right Ear: Tympanic membrane normal.     Left Ear: Tympanic membrane normal.     Nose: Nose normal.     Mouth/Throat:     Mouth: Mucous membranes are moist.  Eyes:     Pupils: Pupils are equal, round, and reactive to light.  Neck:     Vascular: No carotid bruit or JVD.  Cardiovascular:     Rate and Rhythm: Normal rate and regular rhythm.     Heart sounds: Normal heart sounds.  Pulmonary:     Effort: Pulmonary effort is normal. No respiratory  distress.     Breath sounds: Wheezing (faint exp wheezes bil) present. No rales.  Chest:     Chest wall: No tenderness.  Abdominal:     General: Bowel sounds are normal. There is no distension or abdominal bruit.     Palpations: Abdomen is soft. There is no hepatomegaly, splenomegaly, mass or pulsatile mass.     Tenderness: There is no abdominal tenderness.  Musculoskeletal:        General: Normal range of motion.     Cervical back: Normal range of motion and neck supple.  Lymphadenopathy:     Cervical: No cervical adenopathy.  Skin:    General: Skin is warm and dry.  Neurological:     Mental Status: She is alert and oriented to person, place, and time.     Deep Tendon Reflexes: Reflexes are normal and symmetric.  Psychiatric:        Behavior: Behavior normal.        Thought Content: Thought content normal.        Judgment: Judgment normal.     BP 114/69   Pulse 93   Temp 97.8 F (36.6 C) (Temporal)   Resp 20   Ht 5\' 3"  (1.6 m)   Wt 110 lb (49.9 kg)   SpO2 96%   BMI 19.49 kg/m        Assessment & Plan:   in today with chief complaint of Medical Management of Chronic Issues   1. Hyperlipidemia with target LDL less than 100 Low fat diet - atorvastatin (LIPITOR) 40 MG tablet; Take 1 tablet (40 mg total) by mouth daily.  Dispense: 90 tablet; Refill: 1 - CBC with Differential/Platelet - CMP14+EGFR - Lipid panel  2. Acquired hypothyroidism Labs pending - levothyroxine (SYNTHROID) 75 MCG tablet; Take 1 tablet (75 mcg total) by mouth daily before breakfast.  Dispense: 90 tablet; Refill: 1 - Thyroid Panel With TSH  3. GAD (generalized anxiety disorder) Stress management  4. Localized osteoporosis without current pathological fracture Weight bearing exercises - alendronate (FOSAMAX) 70 MG tablet; Take 1 tablet (70 mg total) by mouth every 7 (seven) days. Take with a full glass of water on an empty stomach.  Dispense: 4 tablet; Refill: 11  5.  Smoker Smoking cessation encouraged    The above assessment and management plan was discussed with the patient. The patient verbalized understanding of and has agreed to the management plan. Patient is aware to call the clinic if symptoms  persist or worsen. Patient is aware when to return to the clinic for a follow-up visit. Patient educated on when it is appropriate to go to the emergency department.   Mary-Margaret Hassell Done, FNP

## 2022-02-17 LAB — CMP14+EGFR
ALT: 11 IU/L (ref 0–32)
AST: 21 IU/L (ref 0–40)
Albumin/Globulin Ratio: 2 (ref 1.2–2.2)
Albumin: 4.2 g/dL (ref 3.9–4.9)
Alkaline Phosphatase: 122 IU/L — ABNORMAL HIGH (ref 44–121)
BUN/Creatinine Ratio: 9 — ABNORMAL LOW (ref 12–28)
BUN: 7 mg/dL — ABNORMAL LOW (ref 8–27)
Bilirubin Total: 0.5 mg/dL (ref 0.0–1.2)
CO2: 25 mmol/L (ref 20–29)
Calcium: 9.4 mg/dL (ref 8.7–10.3)
Chloride: 103 mmol/L (ref 96–106)
Creatinine, Ser: 0.81 mg/dL (ref 0.57–1.00)
Globulin, Total: 2.1 g/dL (ref 1.5–4.5)
Glucose: 89 mg/dL (ref 70–99)
Potassium: 4.6 mmol/L (ref 3.5–5.2)
Sodium: 142 mmol/L (ref 134–144)
Total Protein: 6.3 g/dL (ref 6.0–8.5)
eGFR: 82 mL/min/{1.73_m2} (ref >59)

## 2022-02-17 LAB — CBC WITH DIFFERENTIAL/PLATELET
Basophils Absolute: 0 10*3/uL (ref 0.0–0.2)
Basos: 0 %
EOS (ABSOLUTE): 0.1 10*3/uL (ref 0.0–0.4)
Eos: 2 %
Hematocrit: 40 % (ref 34.0–46.6)
Hemoglobin: 13.8 g/dL (ref 11.1–15.9)
Immature Grans (Abs): 0 10*3/uL (ref 0.0–0.1)
Immature Granulocytes: 0 %
Lymphocytes Absolute: 1.8 10*3/uL (ref 0.7–3.1)
Lymphs: 36 %
MCH: 33.4 pg — ABNORMAL HIGH (ref 26.6–33.0)
MCHC: 34.5 g/dL (ref 31.5–35.7)
MCV: 97 fL (ref 79–97)
Monocytes Absolute: 0.3 10*3/uL (ref 0.1–0.9)
Monocytes: 5 %
Neutrophils Absolute: 2.9 10*3/uL (ref 1.4–7.0)
Neutrophils: 57 %
Platelets: 179 10*3/uL (ref 150–450)
RBC: 4.13 x10E6/uL (ref 3.77–5.28)
RDW: 12.2 % (ref 11.7–15.4)
WBC: 5.1 10*3/uL (ref 3.4–10.8)

## 2022-02-17 LAB — THYROID PANEL WITH TSH
Free Thyroxine Index: 2.7 (ref 1.2–4.9)
T3 Uptake Ratio: 30 % (ref 24–39)
T4, Total: 9 ug/dL (ref 4.5–12.0)
TSH: 2.01 u[IU]/mL (ref 0.450–4.500)

## 2022-02-17 LAB — LIPID PANEL
Chol/HDL Ratio: 3.2 ratio (ref 0.0–4.4)
Cholesterol, Total: 110 mg/dL (ref 100–199)
HDL: 34 mg/dL — ABNORMAL LOW (ref >39)
LDL Chol Calc (NIH): 55 mg/dL (ref 0–99)
Triglycerides: 111 mg/dL (ref 0–149)
VLDL Cholesterol Cal: 21 mg/dL (ref 5–40)

## 2022-04-18 DIAGNOSIS — F410 Panic disorder [episodic paroxysmal anxiety] without agoraphobia: Secondary | ICD-10-CM | POA: Diagnosis not present

## 2022-05-24 ENCOUNTER — Encounter: Payer: Self-pay | Admitting: Nurse Practitioner

## 2022-07-18 ENCOUNTER — Other Ambulatory Visit: Payer: Self-pay | Admitting: Nurse Practitioner

## 2022-07-18 DIAGNOSIS — E785 Hyperlipidemia, unspecified: Secondary | ICD-10-CM

## 2022-08-17 ENCOUNTER — Encounter: Payer: Self-pay | Admitting: Nurse Practitioner

## 2022-08-17 ENCOUNTER — Ambulatory Visit: Payer: Medicaid Other | Admitting: Nurse Practitioner

## 2022-08-17 VITALS — BP 121/76 | HR 110 | Temp 97.6°F | Ht 63.0 in | Wt 109.6 lb

## 2022-08-17 DIAGNOSIS — F17200 Nicotine dependence, unspecified, uncomplicated: Secondary | ICD-10-CM | POA: Diagnosis not present

## 2022-08-17 DIAGNOSIS — F411 Generalized anxiety disorder: Secondary | ICD-10-CM | POA: Diagnosis not present

## 2022-08-17 DIAGNOSIS — E785 Hyperlipidemia, unspecified: Secondary | ICD-10-CM | POA: Diagnosis not present

## 2022-08-17 DIAGNOSIS — M8588 Other specified disorders of bone density and structure, other site: Secondary | ICD-10-CM | POA: Diagnosis not present

## 2022-08-17 DIAGNOSIS — E039 Hypothyroidism, unspecified: Secondary | ICD-10-CM

## 2022-08-17 LAB — CMP14+EGFR
ALT: 18 IU/L (ref 0–32)
AST: 30 IU/L (ref 0–40)
Albumin: 4.5 g/dL (ref 3.9–4.9)
Alkaline Phosphatase: 119 IU/L (ref 44–121)
BUN/Creatinine Ratio: 8 — ABNORMAL LOW (ref 12–28)
BUN: 7 mg/dL — ABNORMAL LOW (ref 8–27)
Bilirubin Total: 0.5 mg/dL (ref 0.0–1.2)
CO2: 23 mmol/L (ref 20–29)
Calcium: 9.8 mg/dL (ref 8.7–10.3)
Chloride: 97 mmol/L (ref 96–106)
Creatinine, Ser: 0.91 mg/dL (ref 0.57–1.00)
Globulin, Total: 2.1 g/dL (ref 1.5–4.5)
Glucose: 92 mg/dL (ref 70–99)
Potassium: 4.6 mmol/L (ref 3.5–5.2)
Sodium: 135 mmol/L (ref 134–144)
Total Protein: 6.6 g/dL (ref 6.0–8.5)
eGFR: 70 mL/min/{1.73_m2} (ref >59)

## 2022-08-17 LAB — THYROID PANEL WITH TSH

## 2022-08-17 LAB — CBC WITH DIFFERENTIAL/PLATELET
Basophils Absolute: 0 10*3/uL (ref 0.0–0.2)
Basos: 0 %
EOS (ABSOLUTE): 0 10*3/uL (ref 0.0–0.4)
Eos: 0 %
Hematocrit: 41.1 % (ref 34.0–46.6)
Hemoglobin: 14.2 g/dL (ref 11.1–15.9)
Immature Grans (Abs): 0 10*3/uL (ref 0.0–0.1)
Immature Granulocytes: 0 %
Lymphocytes Absolute: 0.5 10*3/uL — ABNORMAL LOW (ref 0.7–3.1)
Lymphs: 15 %
MCH: 33.3 pg — ABNORMAL HIGH (ref 26.6–33.0)
MCHC: 34.5 g/dL (ref 31.5–35.7)
MCV: 97 fL (ref 79–97)
Monocytes Absolute: 0.4 10*3/uL (ref 0.1–0.9)
Monocytes: 11 %
Neutrophils Absolute: 2.3 10*3/uL (ref 1.4–7.0)
Neutrophils: 74 %
Platelets: 148 10*3/uL — ABNORMAL LOW (ref 150–450)
RBC: 4.26 x10E6/uL (ref 3.77–5.28)
RDW: 12.1 % (ref 11.7–15.4)
WBC: 3.1 10*3/uL — ABNORMAL LOW (ref 3.4–10.8)

## 2022-08-17 LAB — LIPID PANEL
Cholesterol, Total: 98 mg/dL — ABNORMAL LOW (ref 100–199)
Triglycerides: 71 mg/dL (ref 0–149)

## 2022-08-17 MED ORDER — LEVOTHYROXINE SODIUM 75 MCG PO TABS: 75.0000 ug | ORAL_TABLET | Freq: Every day | ORAL | 1 refills | Status: DC

## 2022-08-17 MED ORDER — ATORVASTATIN CALCIUM 40 MG PO TABS: 40.0000 mg | ORAL_TABLET | Freq: Every day | ORAL | 1 refills | Status: DC

## 2022-08-17 NOTE — Progress Notes (Signed)
Subjective:    Patient ID: Jennifer [name], female    DOB: 1960-10-02, 62 y.o.   MRN: [id number]   Chief Complaint: medical management of chronic issues     HPI:  Jennifer [name] is a 62 y.o. who identifies as a female who was assigned female at birth.   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 Hyperlipidemia with target LDL less than 100 Does watchdiet and stays active. Lab Results  Component Value Date   CHOL 110 02/16/2022   HDL 34 (L) 02/16/2022   LDLCALC 55 02/16/2022   TRIG 111 02/16/2022   CHOLHDL 3.2 02/16/2022     2. Acquired hypothyroidism No issues that she is aware of. Lab Results  Component Value Date   TSH 2.010 02/16/2022     3. GAD (generalized anxiety disorder) Is on xanax 4x a day and has been for awhile. Sees psych every few months    02/16/2022   11:15 AM 04/25/2021    9:20 AM 01/25/2021   10:05 AM 07/23/2020   10:22 AM  GAD 7 : Generalized Anxiety Score  Nervous, Anxious, on Edge 0 2 0 0  Control/stop worrying 0 1 0 0  Worry too much - different things 0 1 0 0  Trouble relaxing 0 0 0 0  Restless 0 0 0 0  Easily annoyed or irritable 0 0 0 0  Afraid - awful might happen  0 0 0  Total GAD 7 Score  4 0 0  Anxiety Difficulty Not difficult at all Not difficult at all Not difficult at all Not difficult at all      4. Osteopenia of lumbar spine Last dexascan was done on 07/28/21. T score was -2.4. she is scared to take fosamax.  5. Smoker Still smokes over a pack a day. Refuses low dose ct scan.   New complaints: None today  Allergies  Allergen Reactions   Flagyl [Metronidazole]    Ivp Dye [Iodinated Contrast Media]    Penicillins    Outpatient Encounter Medications as of 08/17/2022  Medication Sig   alendronate (FOSAMAX) 70 MG tablet Take 1 tablet (70 mg total) by mouth every 7 (seven) days. Take with a full glass of water on an empty  stomach.   ALPRAZolam (XANAX) 0.5 MG tablet Take 0.5 mg by mouth 4 (four) times daily as needed.   atorvastatin (LIPITOR) 40 MG tablet Take 1 tablet (40 mg total) by mouth daily.   levothyroxine (SYNTHROID) 75 MCG tablet Take 1 tablet (75 mcg total) by mouth daily before breakfast.   No facility-administered encounter medications on file as of 08/17/2022.    Past Surgical History:  Procedure Laterality Date   LASER ABLATION OF THE CERVIX      Family History  Problem Relation Age of Onset   Stroke Mother    Kidney disease Mother    Hip fracture Mother    Emphysema Father    Cancer Father    Heart disease Sister    Healthy Brother    Thyroid disease Sister       Controlled substance contract: n/a     Review of Systems  Constitutional:  Negative for diaphoresis.  Eyes:  Negative for pain.  Respiratory:  Negative for shortness of breath.   Cardiovascular:  Negative for chest pain, palpitations and leg swelling.  Gastrointestinal:  Negative for abdominal pain.  Endocrine: Negative for  polydipsia.  Skin:  Negative for rash.  Neurological:  Negative for dizziness, weakness and headaches.  Hematological:  Does not bruise/bleed easily.  All other systems reviewed and are negative.      Objective:   Physical Exam Vitals and nursing note reviewed.  Constitutional:      General: She is not in acute distress.    Appearance: Normal appearance. She is well-developed.  HENT:     Head: Normocephalic.     Right Ear: Tympanic membrane normal.     Left Ear: Tympanic membrane normal.     Nose: Nose normal.     Mouth/Throat:     Mouth: Mucous membranes are moist.  Eyes:     Pupils: Pupils are equal, round, and reactive to light.  Neck:     Vascular: No carotid bruit or JVD.  Cardiovascular:     Rate and Rhythm: Normal rate and regular rhythm.     Heart sounds: Normal heart sounds.  Pulmonary:     Effort: Pulmonary effort is normal. No respiratory distress.     Breath  sounds: Normal breath sounds. No wheezing or rales.  Chest:     Chest wall: No tenderness.  Abdominal:     General: Bowel sounds are normal. There is no distension or abdominal bruit.     Palpations: Abdomen is soft. There is no hepatomegaly, splenomegaly, mass or pulsatile mass.     Tenderness: There is no abdominal tenderness.  Musculoskeletal:        General: Normal range of motion.     Cervical back: Normal range of motion and neck supple.  Lymphadenopathy:     Cervical: No cervical adenopathy.  Skin:    General: Skin is warm and dry.  Neurological:     Mental Status: She is alert and oriented to person, place, and time.     Deep Tendon Reflexes: Reflexes are normal and symmetric.  Psychiatric:        Behavior: Behavior normal.        Thought Content: Thought content normal.        Judgment: Judgment normal.    BP 121/76   Pulse (!) 110   Temp 97.6 F (36.4 C) (Temporal)   Ht 5\' 3"  (1.6 m)   Wt 109 lb 9.6 oz (49.7 kg)   SpO2 97%   BMI 19.41 kg/m         Assessment & Plan:  Benjamin Merrihew comes in today with chief complaint of Medical Management of Chronic Issues   Diagnosis and orders addressed:  1. Hyperlipidemia with target LDL less than 100 Low fat diet - atorvastatin (LIPITOR) 40 MG tablet; Take 1 tablet (40 mg total) by mouth daily.  Dispense: 90 tablet; Refill: 1 - CBC with Differential/Platelet - CMP14+EGFR - Lipid panel  2. Acquired hypothyroidism Labs pending - levothyroxine (SYNTHROID) 75 MCG tablet; Take 1 tablet (75 mcg total) by mouth daily before breakfast.  Dispense: 90 tablet; Refill: 1 - Thyroid Panel With TSH  3. GAD (generalized anxiety disorder) Continue follow up with psych  4. Osteopenia of lumbar spine Refuses treatment with anything other than calcium and vitamin d supplement Weight bearing exercises encouraged  5. Smoker Smoking cessation encouraged Refuse low dose ct san   Labs pending Health Maintenance reviewed Diet  and exercise encouraged  Follow up plan: 6 months   Mary-Margaret Daphine Deutscher, FNP

## 2022-08-17 NOTE — Patient Instructions (Signed)
Bone Health Bones protect organs, store calcium, anchor muscles, and support the whole body. Keeping your bones strong is important, especially as you get older. You can take actions to help keep your bones strong and healthy. Why is keeping my bones healthy important?  Keeping your bones healthy is important because your body constantly replaces bone cells. Cells get old, and new cells take their place. As we age, we lose bone cells because the body may not be able to make enough new cells to replace the old cells. The amount of bone cells and bone tissue you have is referred to as bone mass. The higher your bone mass, the stronger your bones. The aging process leads to an overall loss of bone mass in the body, which can increase the likelihood of: Broken bones. A condition in which the bones become weak and brittle (osteoporosis). A large decline in bone mass occurs in older adults. In women, it occurs about the time of menopause. What actions can I take to keep my bones healthy? Good health habits are important for maintaining healthy bones. This includes eating nutritious foods and exercising regularly. To have healthy bones, you need to get enough of the right minerals and vitamins. Most nutrition experts recommend getting these nutrients from the foods that you eat. In some cases, taking supplements may also be recommended. Doing certain types of exercise is also important for bone health. What are the nutritional recommendations for healthy bones?  Eating a well-balanced diet with plenty of calcium and vitamin D will help to protect your bones. Nutritional recommendations vary from person to person. Ask your health care provider what is healthy for you. Here are some general guidelines. Get enough calcium Calcium is the most important (essential) mineral for bone health. Most people can get enough calcium from their diet, but supplements may be recommended for people who are at risk for  osteoporosis. Good sources of calcium include: Dairy products, such as low-fat or nonfat milk, cheese, and yogurt. Dark green leafy vegetables, such as bok choy and broccoli. Foods that have calcium added to them (are fortified). Foods that may be fortified with calcium include orange juice, cereal, bread, soy beverages, and tofu products. Nuts, such as almonds. Follow these recommended amounts for daily calcium intake: Infants, 0-6 months: 200 mg. Infants, 6-12 months: 260 mg. Children, age 1-3: 700 mg. Children, age 4-8: 1,000 mg. Children, age 9-13: 1,300 mg. Teens, age 14-18: 1,300 mg. Adults, age 19-50: 1,000 mg. Adults, age 51-70: Men: 1,000 mg. Women: 1,200 mg. Adults, age 71 or older: 1,200 mg. Pregnant and breastfeeding females: Teens: 1,300 mg. Adults: 1,000 mg. Get enough vitamin D Vitamin D is the most essential vitamin for bone health. It helps the body absorb calcium. Sunlight stimulates the skin to make vitamin D, so be sure to get enough sunlight. If you live in a cold climate or you do not get outside often, your health care provider may recommend that you take vitamin D supplements. Good sources of vitamin D in your diet include: Egg yolks. Saltwater fish. Milk and cereal fortified with vitamin D. Follow these recommended amounts for daily vitamin D intake: Infants, 0-12 months: 400 international units (IU). Children and teens, age 1-18: 600 international units. Adults, age 59 or younger: 600 international units. Adults, age 60 or older: 600-1,000 international units. Get other important nutrients Other nutrients that are important for bone health include: Phosphorus. This mineral is found in meat, poultry, dairy foods, nuts, and legumes. The   recommended daily intake for adult men and adult women is 700 mg. Magnesium. This mineral is found in seeds, nuts, dark green vegetables, and legumes. The recommended daily intake for adult men is 400-420 mg. For adult women,  it is 310-320 mg. Vitamin K. This vitamin is found in green leafy vegetables. The recommended daily intake is 120 mcg for adult men and 90 mcg for adult women. What type of physical activity is best for building and maintaining healthy bones? Weight-bearing and strength-building activities are important for building and maintaining healthy bones. Weight-bearing activities cause muscles and bones to work against gravity. Strength-building activities increase the strength of the muscles that support bones. Weight-bearing and muscle-building activities include: Walking and hiking. Jogging and running. Dancing. Gym exercises. Lifting weights. Tennis and racquetball. Climbing stairs. Aerobics. Adults should get at least 30 minutes of moderate physical activity on most days. Children should get at least 60 minutes of moderate physical activity on most days. Ask your health care provider what type of exercise is best for you. How can I find out if my bone mass is low? Bone mass can be measured with an X-ray test called a bone mineral density (BMD) test. This test is recommended for all women who are age 65 or older. It may also be recommended for: Men who are age 70 or older. People who are at risk for osteoporosis because of: Having a long-term disease that weakens bones, such as kidney disease or rheumatoid arthritis. Having menopause earlier than normal. Taking medicine that weakens bones, such as steroids, thyroid hormones, or hormone treatment for breast cancer or prostate cancer. Smoking. Drinking three or more alcoholic drinks a day. Being underweight. Sedentary lifestyle. If you find that you have a low bone mass, you may be able to prevent osteoporosis or further bone loss by changing your diet and lifestyle. Where can I find more information? Bone Health & Osteoporosis Foundation: www.nof.org/patients National Institutes of Health: www.bones.nih.gov International Osteoporosis  Foundation: www.iofbonehealth.org Summary The aging process leads to an overall loss of bone mass in the body, which can increase the likelihood of broken bones and osteoporosis. Eating a well-balanced diet with plenty of calcium and vitamin D will help to protect your bones. Weight-bearing and strength-building activities are also important for building and maintaining strong bones. Bone mass can be measured with an X-ray test called a bone mineral density (BMD) test. This information is not intended to replace advice given to you by your health care provider. Make sure you discuss any questions you have with your health care provider. Document Revised: 06/23/2020 Document Reviewed: 06/23/2020 Elsevier Patient Education  2024 Elsevier Inc.  

## 2022-08-18 NOTE — Addendum Note (Signed)
Addended by: Bennie Pierini on: 08/18/2022 12:49 PM   Modules accepted: Level of Service

## 2022-09-04 DIAGNOSIS — F410 Panic disorder [episodic paroxysmal anxiety] without agoraphobia: Secondary | ICD-10-CM | POA: Diagnosis not present

## 2022-10-09 DIAGNOSIS — F411 Generalized anxiety disorder: Secondary | ICD-10-CM | POA: Diagnosis not present

## 2023-01-03 ENCOUNTER — Encounter: Payer: Self-pay | Admitting: Nurse Practitioner

## 2023-01-09 DIAGNOSIS — F410 Panic disorder [episodic paroxysmal anxiety] without agoraphobia: Secondary | ICD-10-CM | POA: Diagnosis not present

## 2023-02-19 ENCOUNTER — Ambulatory Visit (INDEPENDENT_AMBULATORY_CARE_PROVIDER_SITE_OTHER): Payer: Medicaid Other

## 2023-02-19 ENCOUNTER — Encounter: Payer: Self-pay | Admitting: Nurse Practitioner

## 2023-02-19 ENCOUNTER — Ambulatory Visit: Payer: Medicaid Other | Admitting: Nurse Practitioner

## 2023-02-19 VITALS — BP 121/79 | HR 99 | Temp 97.8°F | Ht 63.0 in | Wt 115.0 lb

## 2023-02-19 DIAGNOSIS — F17200 Nicotine dependence, unspecified, uncomplicated: Secondary | ICD-10-CM

## 2023-02-19 DIAGNOSIS — F411 Generalized anxiety disorder: Secondary | ICD-10-CM

## 2023-02-19 DIAGNOSIS — M8588 Other specified disorders of bone density and structure, other site: Secondary | ICD-10-CM

## 2023-02-19 DIAGNOSIS — E785 Hyperlipidemia, unspecified: Secondary | ICD-10-CM | POA: Diagnosis not present

## 2023-02-19 DIAGNOSIS — E039 Hypothyroidism, unspecified: Secondary | ICD-10-CM

## 2023-02-19 MED ORDER — LEVOTHYROXINE SODIUM 75 MCG PO TABS
75.0000 ug | ORAL_TABLET | Freq: Every day | ORAL | 1 refills | Status: DC
Start: 2023-02-19 — End: 2023-02-20

## 2023-02-19 MED ORDER — ATORVASTATIN CALCIUM 40 MG PO TABS
40.0000 mg | ORAL_TABLET | Freq: Every day | ORAL | 1 refills | Status: DC
Start: 2023-02-19 — End: 2023-07-20

## 2023-02-19 NOTE — Progress Notes (Signed)
Subjective:    Patient ID: Denna Fryberger, female    DOB: 1960-03-22, 63 y.o.   MRN: [id number]   Chief Complaint: medical management of chronic issues     HPI:  Adeleigh Barletta is a 63 y.o. who identifies as a female who was assigned female at birth.   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 Hyperlipidemia with target LDL less than 100 Does watchdiet and stays active. Lab Results  Component Value Date   CHOL 98 (L) 08/17/2022   HDL 40 08/17/2022   LDLCALC 43 08/17/2022   TRIG 71 08/17/2022   CHOLHDL 2.5 08/17/2022   The ASCVD Risk score (Arnett DK, et al., 2019) failed to calculate for the following reasons:   The valid total cholesterol range is 130 to 320 mg/dL   2. Acquired hypothyroidism No issues that she is aware of. Lab Results  Component Value Date   TSH 4.220 08/17/2022     3. GAD (generalized anxiety disorder) Is on xanax 4x a day and has been for awhile. Sees psych every few months    02/16/2022   11:15 AM 04/25/2021    9:20 AM 01/25/2021   10:05 AM 07/23/2020   10:22 AM  GAD 7 : Generalized Anxiety Score  Nervous, Anxious, on Edge 0 2 0 0  Control/stop worrying 0 1 0 0  Worry too much - different things 0 1 0 0  Trouble relaxing 0 0 0 0  Restless 0 0 0 0  Easily annoyed or irritable 0 0 0 0  Afraid - awful might happen  0 0 0  Total GAD 7 Score  4 0 0  Anxiety Difficulty Not difficult at all Not difficult at all Not difficult at all Not difficult at all         4. Osteopenia of lumbar spine Last dexascan was done on 07/28/21. T score was -2.4. she is scared to take fosamax.  5. Smoker Still smokes over a pack a day. Refuses low dose ct scan.   New complaints: None today  Allergies  Allergen Reactions   Flagyl [Metronidazole]    Ivp Dye [Iodinated Contrast Media]    Penicillins    Outpatient Encounter Medications as of 02/19/2023   Medication Sig   ALPRAZolam (XANAX) 0.5 MG tablet Take 0.5 mg by mouth 4 (four) times daily as needed.   atorvastatin (LIPITOR) 40 MG tablet Take 1 tablet (40 mg total) by mouth daily.   levothyroxine (SYNTHROID) 75 MCG tablet Take 1 tablet (75 mcg total) by mouth daily before breakfast.   No facility-administered encounter medications on file as of 02/19/2023.    Past Surgical History:  Procedure Laterality Date   LASER ABLATION OF THE CERVIX      Family History  Problem Relation Age of Onset   Stroke Mother    Kidney disease Mother    Hip fracture Mother    Emphysema Father    Cancer Father    Heart disease Sister    Healthy Brother    Thyroid disease Sister       Controlled substance contract: n/a     Review of Systems  Constitutional:  Negative for diaphoresis.  Eyes:  Negative for pain.  Respiratory:  Negative for shortness of breath.   Cardiovascular:  Negative for chest pain, palpitations and leg swelling.  Gastrointestinal:  Negative for abdominal pain.  Endocrine: Negative  for polydipsia.  Skin:  Negative for rash.  Neurological:  Negative for dizziness, weakness and headaches.  Hematological:  Does not bruise/bleed easily.  All other systems reviewed and are negative.      Objective:   Physical Exam Vitals and nursing note reviewed.  Constitutional:      General: She is not in acute distress.    Appearance: Normal appearance. She is well-developed.  HENT:     Head: Normocephalic.     Right Ear: Tympanic membrane normal.     Left Ear: Tympanic membrane normal.     Nose: Nose normal.     Mouth/Throat:     Mouth: Mucous membranes are moist.  Eyes:     Pupils: Pupils are equal, round, and reactive to light.  Neck:     Vascular: No carotid bruit or JVD.  Cardiovascular:     Rate and Rhythm: Normal rate and regular rhythm.     Heart sounds: Normal heart sounds.  Pulmonary:     Effort: Pulmonary effort is normal. No respiratory distress.      Breath sounds: Normal breath sounds. No wheezing or rales.  Chest:     Chest wall: No tenderness.  Abdominal:     General: Bowel sounds are normal. There is no distension or abdominal bruit.     Palpations: Abdomen is soft. There is no hepatomegaly, splenomegaly, mass or pulsatile mass.     Tenderness: There is no abdominal tenderness.  Musculoskeletal:        General: Normal range of motion.     Cervical back: Normal range of motion and neck supple.  Lymphadenopathy:     Cervical: No cervical adenopathy.  Skin:    General: Skin is warm and dry.  Neurological:     Mental Status: She is alert and oriented to person, place, and time.     Deep Tendon Reflexes: Reflexes are normal and symmetric.  Psychiatric:        Behavior: Behavior normal.        Thought Content: Thought content normal.        Judgment: Judgment normal.    BP 121/79   Pulse 99   Temp 97.8 F (36.6 C) (Temporal)   Ht 5\' 3"  (1.6 m)   Wt 115 lb (52.2 kg)   SpO2 100%   BMI 20.37 kg/m    EKG- NSR-Mary-Margaret Daphine Deutscher, FNP  Chest xray- radiology report pending-Preliminary reading by Paulene Floor, FNP  [facility]       Assessment & Plan:  Danni Shima comes in today with chief complaint of No chief complaint on file.   Diagnosis and orders addressed:  1. Hyperlipidemia with target LDL less than 100 Low fat diet - atorvastatin (LIPITOR) 40 MG tablet; Take 1 tablet (40 mg total) by mouth daily.  Dispense: 90 tablet; Refill: 1 - CBC with Differential/Platelet - CMP14+EGFR - Lipid panel  2. Acquired hypothyroidism Labs pending - levothyroxine (SYNTHROID) 75 MCG tablet; Take 1 tablet (75 mcg total) by mouth daily before breakfast.  Dispense: 90 tablet; Refill: 1 - Thyroid Panel With TSH  3. GAD (generalized anxiety disorder) Continue follow up with psych  4. Osteopenia of lumbar spine Refuses treatment with anything other than calcium and vitamin d supplement Weight bearing exercises encouraged  5.  Smoker Smoking cessation encouraged Refuse low dose ct san   Labs pending Health Maintenance reviewed Diet and exercise encouraged  Follow up plan: 6 months   Mary-Margaret Daphine Deutscher, FNP

## 2023-02-19 NOTE — Patient Instructions (Signed)

## 2023-02-20 LAB — CBC WITH DIFFERENTIAL/PLATELET
Basophils Absolute: 0 10*3/uL (ref 0.0–0.2)
Basos: 0 %
EOS (ABSOLUTE): 0 10*3/uL (ref 0.0–0.4)
Eos: 0 %
Hematocrit: 42.1 % (ref 34.0–46.6)
Hemoglobin: 14.6 g/dL (ref 11.1–15.9)
Immature Grans (Abs): 0 10*3/uL (ref 0.0–0.1)
Immature Granulocytes: 0 %
Lymphocytes Absolute: 1.5 10*3/uL (ref 0.7–3.1)
Lymphs: 20 %
MCH: 33.6 pg — ABNORMAL HIGH (ref 26.6–33.0)
MCHC: 34.7 g/dL (ref 31.5–35.7)
MCV: 97 fL (ref 79–97)
Monocytes Absolute: 0.3 10*3/uL (ref 0.1–0.9)
Monocytes: 4 %
Neutrophils Absolute: 5.6 10*3/uL (ref 1.4–7.0)
Neutrophils: 76 %
Platelets: 187 10*3/uL (ref 150–450)
RBC: 4.35 x10E6/uL (ref 3.77–5.28)
RDW: 11.8 % (ref 11.7–15.4)
WBC: 7.5 10*3/uL (ref 3.4–10.8)

## 2023-02-20 LAB — CMP14+EGFR
ALT: 11 [IU]/L (ref 0–32)
AST: 23 [IU]/L (ref 0–40)
Albumin: 4.6 g/dL (ref 3.9–4.9)
Alkaline Phosphatase: 120 [IU]/L (ref 44–121)
BUN/Creatinine Ratio: 10 — ABNORMAL LOW (ref 12–28)
BUN: 9 mg/dL (ref 8–27)
Bilirubin Total: 0.7 mg/dL (ref 0.0–1.2)
CO2: 24 mmol/L (ref 20–29)
Calcium: 9.6 mg/dL (ref 8.7–10.3)
Chloride: 101 mmol/L (ref 96–106)
Creatinine, Ser: 0.86 mg/dL (ref 0.57–1.00)
Globulin, Total: 2.3 g/dL (ref 1.5–4.5)
Glucose: 95 mg/dL (ref 70–99)
Potassium: 4.6 mmol/L (ref 3.5–5.2)
Sodium: 138 mmol/L (ref 134–144)
Total Protein: 6.9 g/dL (ref 6.0–8.5)
eGFR: 75 mL/min/{1.73_m2} (ref >59)

## 2023-02-20 LAB — LIPID PANEL
Chol/HDL Ratio: 2.8 {ratio} (ref 0.0–4.4)
Cholesterol, Total: 129 mg/dL (ref 100–199)
HDL: 46 mg/dL (ref >39)
LDL Chol Calc (NIH): 65 mg/dL (ref 0–99)
Triglycerides: 97 mg/dL (ref 0–149)
VLDL Cholesterol Cal: 18 mg/dL (ref 5–40)

## 2023-02-20 LAB — THYROID PANEL WITH TSH
Free Thyroxine Index: 2.4 (ref 1.2–4.9)
T3 Uptake Ratio: 26 % (ref 24–39)
T4, Total: 9.4 ug/dL (ref 4.5–12.0)
TSH: 6.87 u[IU]/mL — ABNORMAL HIGH (ref 0.450–4.500)

## 2023-02-20 MED ORDER — LEVOTHYROXINE SODIUM 88 MCG PO TABS: 88.0000 ug | ORAL_TABLET | Freq: Every day | ORAL | 1 refills | Status: DC

## 2023-02-20 NOTE — Addendum Note (Signed)
Addended by: Bennie Pierini on: 02/20/2023 12:57 PM   Modules accepted: Orders

## 2023-02-27 ENCOUNTER — Other Ambulatory Visit: Payer: Self-pay

## 2023-02-27 ENCOUNTER — Telehealth: Payer: Self-pay | Admitting: Nurse Practitioner

## 2023-02-27 DIAGNOSIS — R7989 Other specified abnormal findings of blood chemistry: Secondary | ICD-10-CM

## 2023-02-27 NOTE — Telephone Encounter (Signed)
Copied from CRM [id number]. Topic: Clinical - Medication Question >> Feb 27, 2023 12:23 PM Alvino Blood C wrote: Reason for CRM: Patient would like a call back to see why her medications have been changed, [phone]

## 2023-02-27 NOTE — Telephone Encounter (Signed)
Called and spoke to patient. Advised that her TSH was elevated this time and needs an adjustment of meds. Rx sent to pharmacy and future orders placed for 8 weeks. Patient verbalized understanding.

## 2023-04-16 ENCOUNTER — Other Ambulatory Visit: Payer: Self-pay | Admitting: Nurse Practitioner

## 2023-04-17 ENCOUNTER — Other Ambulatory Visit

## 2023-04-17 DIAGNOSIS — R7989 Other specified abnormal findings of blood chemistry: Secondary | ICD-10-CM | POA: Diagnosis not present

## 2023-04-17 DIAGNOSIS — R6889 Other general symptoms and signs: Secondary | ICD-10-CM | POA: Diagnosis not present

## 2023-04-18 LAB — THYROID PANEL WITH TSH
Free Thyroxine Index: 3.5 (ref 1.2–4.9)
T3 Uptake Ratio: 30 % (ref 24–39)
T4, Total: 11.7 ug/dL (ref 4.5–12.0)
TSH: 0.517 u[IU]/mL (ref 0.450–4.500)

## 2023-06-07 ENCOUNTER — Ambulatory Visit: Payer: Self-pay | Admitting: Nurse Practitioner

## 2023-06-07 ENCOUNTER — Ambulatory Visit: Admitting: Nurse Practitioner

## 2023-06-07 ENCOUNTER — Ambulatory Visit (INDEPENDENT_AMBULATORY_CARE_PROVIDER_SITE_OTHER)

## 2023-06-07 ENCOUNTER — Encounter: Payer: Self-pay | Admitting: Nurse Practitioner

## 2023-06-07 VITALS — BP 141/72 | HR 112 | Temp 97.2°F | Ht 63.0 in | Wt 115.0 lb

## 2023-06-07 DIAGNOSIS — R0602 Shortness of breath: Secondary | ICD-10-CM | POA: Diagnosis not present

## 2023-06-07 DIAGNOSIS — J440 Chronic obstructive pulmonary disease with acute lower respiratory infection: Secondary | ICD-10-CM

## 2023-06-07 DIAGNOSIS — J209 Acute bronchitis, unspecified: Secondary | ICD-10-CM | POA: Diagnosis not present

## 2023-06-07 DIAGNOSIS — Q765 Cervical rib: Secondary | ICD-10-CM | POA: Diagnosis not present

## 2023-06-07 MED ORDER — AZITHROMYCIN 250 MG PO TABS
ORAL_TABLET | ORAL | 0 refills | Status: DC
Start: 2023-06-07 — End: 2023-06-28

## 2023-06-07 MED ORDER — BUDESONIDE-FORMOTEROL FUMARATE 80-4.5 MCG/ACT IN AERO: 2.0000 | INHALATION_SPRAY | Freq: Two times a day (BID) | RESPIRATORY_TRACT | 3 refills | Status: DC

## 2023-06-07 MED ORDER — ALBUTEROL SULFATE HFA 108 (90 BASE) MCG/ACT IN AERS
2.0000 | INHALATION_SPRAY | Freq: Four times a day (QID) | RESPIRATORY_TRACT | 0 refills | Status: DC | PRN
Start: 2023-06-07 — End: 2023-11-26

## 2023-06-07 MED ORDER — METHYLPREDNISOLONE ACETATE 80 MG/ML IJ SUSP
80.0000 mg | Freq: Once | INTRAMUSCULAR | Status: AC
  Administered 2023-06-07: 80 mg via INTRAMUSCULAR

## 2023-06-07 MED ORDER — PREDNISONE 20 MG PO TABS
ORAL_TABLET | ORAL | 0 refills | Status: DC
Start: 2023-06-07 — End: 2023-06-28

## 2023-06-07 MED ORDER — LEVALBUTEROL HCL 1.25 MG/3ML IN NEBU
1.2500 mg | INHALATION_SOLUTION | RESPIRATORY_TRACT | Status: AC
  Administered 2023-06-07: 1.25 mg via RESPIRATORY_TRACT

## 2023-06-07 NOTE — Progress Notes (Addendum)
 Subjective:    Patient ID: Jennifer [name], female    DOB: [birth year], 63 y.o.   MRN: [id number]   Chief Complaint: chest congestion and Cough   Patient come sin today c/o of increasing worsening cough for the last 6 days. . Chest hurting from coughing so much. Patient is a heavy smoker and has been smoking for many years.  Cough This is a new problem. The current episode started in the past 7 days. The problem has been gradually worsening. The problem occurs constantly. The cough is Non-productive. Associated symptoms include shortness of breath. Pertinent negatives include no chills or fever. Nothing (smoking) aggravates the symptoms. She has tried nothing for the symptoms. The treatment provided mild relief.    Patient Active Problem List   Diagnosis Date Noted   Smoker 01/18/2018   Osteopenia 06/10/2014   Hyperlipidemia with target LDL less than 100 05/06/2012   GAD (generalized anxiety disorder) 05/06/2012   Hypothyroidism 05/06/2012       Review of Systems  Constitutional:  Negative for chills and fever.  Respiratory:  Positive for cough and shortness of breath.        Objective:   Physical Exam Constitutional:      Appearance: Normal appearance.  Cardiovascular:     Rate and Rhythm: Tachycardia present.     Heart sounds: No murmur heard. Pulmonary:     Effort: Respiratory distress present.     Breath sounds: Stridor (mild) present. Wheezing (exp and insp throughout) present.  Neurological:     General: No focal deficit present.     Mental Status: She is alert and oriented to person, place, and time.  Psychiatric:        Mood and Affect: Mood normal.        Behavior: Behavior normal.      BP (!) 141/72   Pulse (!) 112   Temp (!) 97.2 F (36.2 C) (Temporal)   Ht 5\' 3"  (1.6 m)   Wt 115 lb (52.2 kg)   SpO2 94%   BMI 20.37 kg/m  Chest xray-- bronchitic changes- radiology report pending-Preliminary reading by Irvine Mantis, FNP  [facility] EKG- sinus  tachycardia  O2 sat at rest- 94% p 117 O2 sat with activity 95% p 127 Stasis post nebulizer- exp wheeze with breath sounds not as course-.mmms    Assessment & Plan:   Jennifer [name] in today with chief complaint of chest congestion and Cough   1. SOB (shortness of breath) (Primary) - DG Chest 2 View - EKG 12-Lead - levalbuterol  (XOPENEX ) nebulizer solution 1.25 mg  2. Acute bronchitis with COPD (HCC) Steroid shot today- start oral steroid tomorrow Albuterol  HFA as needed Q6 hours Symbicort  BID Smoking cessation encouraged - budesonide -formoterol  (SYMBICORT ) 80-4.5 MCG/ACT inhaler; Inhale 2 puffs into the lungs 2 (two) times daily.  Dispense: 1 each; Refill: 3 - albuterol  (VENTOLIN  HFA) 108 (90 Base) MCG/ACT inhaler; Inhale 2 puffs into the lungs every 6 (six) hours as needed for wheezing or shortness of breath.  Dispense: 8 g; Refill: 0 - predniSONE  (DELTASONE ) 20 MG tablet; 2 po at sametime daily for 5 days-  Dispense: 10 tablet; Refill: 0 - methylPREDNISolone acetate (DEPO-MEDROL) injection 80 mg - azithromycin  (ZITHROMAX  Z-PAK) 250 MG tablet; As directed  Dispense: 6 tablet; Refill: 0    The above assessment and management plan was discussed with the patient. The patient verbalized understanding of and has agreed to the management plan. Patient is aware to call the clinic if symptoms persist or  worsen. Patient is aware when to return to the clinic for a follow-up visit. Patient educated on when it is appropriate to go to the emergency department.   Mary-Margaret Gaylyn Keas, FNP

## 2023-06-07 NOTE — Patient Instructions (Signed)
 Chronic Obstructive Pulmonary Disease Exacerbation  Chronic obstructive pulmonary disease (COPD) is a long-term (chronic) lung problem. When you have COPD, it can feel harder to breathe in or out. COPD exacerbation is a flare-up of symptoms when breathing gets worse and more treatment may be needed. Without treatment, flare-ups can be life-threatening. If they happen often, your lungs can become more damaged. What are the causes? Not taking your usual COPD medicines as told by your health care provider. A cold or the flu, which can cause infection in your lungs. Being exposed to things that make your breathing worse, such as: Smoke. Air pollution. Fumes. Dust. Allergies. Weather changes. What are the signs or symptoms? Symptoms do not get better or get worse even if you take your medicines as told by your provider. Symptoms may include: More shortness of breath. You may only be able to speak one or two words at a time. More coughing or mucus from your lungs. More wheezing or chest tightness. Being more tired and having less energy. Confusion. How is this diagnosed? This condition is diagnosed based on: Symptoms that get worse. Your medical history. A physical exam. You may also have tests, including: A chest X-ray. Blood or mucus tests. How is this treated? You may be able to stay home or you may need to go to the hospital. Treatment may include: Taking medicines. These may include: Inhalers. These have medicines in them that you breathe in. These may be more of what you already take or they may be new. Steroids. These reduce inflammation in the airways. These may be inhaled, taken by mouth, or given in an IV. Antibiotics. These treat infection. Using oxygen. Using a device to help you clear mucus. Follow these instructions at home: Medicines Take your medicines only as told by your provider. If you were given antibiotics or steroids, take them as told by your provider. Do  not stop taking them even if you start to feel better. Lifestyle Several times a day, wash your hands with soap and water for at least 20 seconds. If you cannot use soap and water, use hand sanitizer. This may help keep you from getting an infection. Avoid being around crowds or people who are sick. Do not smoke or use any products that contain nicotine or tobacco. If you need help quitting, ask your provider. Return to your normal activities when your provider says that it's safe. Use breathing methods to control your stress and catch your breath. How is this prevented? Follow your COPD action plan. The action plan tells you what to do if you're feeling good and what to do when you start feeling worse. Discuss the plan often with your provider. Make sure you get all the shots, also called vaccines, that your provider recommends. Ask your provider about a flu shot and a pneumonia shot. Use oxygen therapy if told by your provider. If you need home oxygen therapy, ask your provider how often to check your oxygen level with a device called an oximeter. Keep all follow-up visits to review your COPD action plan. Your provider will want to check on your condition often to keep you healthy and out of the hospital. Contact a health care provider if: Your COPD symptoms get worse. You have a fever or chills. You have trouble doing daily activities. You have trouble breathing even when you are resting. Get help right away if: You are short of breath and cannot: Talk in full sentences. Do normal activities. You have chest  pain. You feel confused. These symptoms may be an emergency. Call 911 right away. Do not wait to see if the symptoms will go away. Do not drive yourself to the hospital. This information is not intended to replace advice given to you by your health care provider. Make sure you discuss any questions you have with your health care provider. Document Revised: 10/12/2022 Document  Reviewed: 03/27/2022 Elsevier Patient Education  2024 [employer].

## 2023-06-08 ENCOUNTER — Encounter: Payer: Self-pay | Admitting: Nurse Practitioner

## 2023-06-08 ENCOUNTER — Ambulatory Visit (INDEPENDENT_AMBULATORY_CARE_PROVIDER_SITE_OTHER): Admitting: Nurse Practitioner

## 2023-06-08 ENCOUNTER — Telehealth: Payer: Self-pay

## 2023-06-08 VITALS — BP 151/85 | HR 93 | Temp 97.6°F | Ht 63.0 in | Wt 115.0 lb

## 2023-06-08 DIAGNOSIS — J411 Mucopurulent chronic bronchitis: Secondary | ICD-10-CM

## 2023-06-08 MED ORDER — IPRATROPIUM-ALBUTEROL 0.5-2.5 (3) MG/3ML IN SOLN: 3.0000 mL | RESPIRATORY_TRACT | 0 refills | Status: AC | PRN

## 2023-06-08 MED ORDER — LIDOCAINE VISCOUS HCL 2 % MT SOLN: 5.0000 mL | Freq: Four times a day (QID) | OROMUCOSAL | 0 refills | Status: DC

## 2023-06-08 NOTE — Addendum Note (Signed)
 Addended by: Deanna Wiater, MARY-MARGARET on: 06/08/2023 01:37 PM   Modules accepted: Orders

## 2023-06-08 NOTE — Telephone Encounter (Signed)
 Copied from CRM [id number]. Topic: Clinical - Medication Question >> Jun 08, 2023 12:34 PM Arlie Benedict B wrote: Reason for CRM: [facility] [phone], is requesting a call back she stated the patient called in regards to some mouth rinse, that they don't have  prescription for. She stated to advised the provider the only thing they would be able to provide with prescription is the Nystatin  Oral Suspension. Please call Ms. Ardelia Beau at pharmacy to advise.

## 2023-06-08 NOTE — Patient Instructions (Signed)
 How to Use a Nebulizer, Adult  A nebulizer is a device that turns liquid medicine into a mist or vapor that you can breathe in (inhale). This medicine helps to open the air passages in your lungs. You may need to use a nebulizer if you have an acute breathing illness, such as pneumonia. A nebulizer may also be used to treat chronic conditions, such as asthma or chronic obstructive pulmonarydisease (COPD). There are different kinds of nebulizers. With some nebulizers, you breathe in medicine through a mouthpiece. With others, you get medicine through a mask that fits over your nose and mouth. What are the risks? If you use a nebulizer that does not fit right or is not cleaned properly, it can cause some problems, including: Infection. Eye irritation. Delivery of too much medicine or not enough medicine. Mouth irritation. Supplies needed: Air compressor (nebulizer machine). Nebulizer medicine cup (reservoir)and tubing. Mouthpiece or face mask. Soap and water. Sterile or distilled water. Clean towel. How to use a nebulizer     Preparing a nebulizer Take these steps before using your nebulizer: Read the manufacturer's instructions for your nebulizer, as machines vary. Check your medicine. Make sure it has not expired and is not damaged in any way. Wash your hands with soap and water. Put all of the parts of your nebulizer on a sturdy, flat surface. Connect the tubing to the nebulizer machine and to the reservoir. Measure the liquid medicine according to instructions from your health care provider. Pour the liquid into the reservoir. Attach the mouthpiece or mask. Test the nebulizer by turning it on to make sure that a spray comes out. Then, turn it off. Using a nebulizer Be sure to stop the machine at any time if you start coughing or if the medicine foams or bubbles. Sit in an upright, relaxed position. If your nebulizer has a mask, put it over your nose and mouth. It should fit  somewhat snugly, with no gaps around the nose or cheeks where medicine could escape. If you use a mouthpiece, put it in your mouth. Press your lips firmly around the mouthpiece. Turn on the nebulizer. Some nebulizers have a finger valve. If yours does, cover up the air hole so the air gets to the nebulizer. Once the medicine begins to mist out, take slow, deep breaths. If there is a finger valve, release it at the end of your breath. Continue taking slow, deep breaths until the medicine in the nebulizer is gone and no mist appears. Cleaning a nebulizer The nebulizer and all of its parts must be kept very clean. If the nebulizer and its parts are not cleaned properly, bacteria can grow inside of them. If you inhale the bacteria, you can get sick. Follow the manufacturer's instructions for cleaning your nebulizer. For most nebulizers, you should follow these guidelines: Clean the mouthpiece or mask and the reservoir by: Rinsing them after each use. Use sterile or distilled water. Washing them 1-2 times a week using soap and warm water. Do not wash the tubing. After you rinse or wash them, place the parts on a clean towel and let them air-dry completely. After they dry, reconnect the pieces and turn the nebulizer on without any medicine in it. Doing this will blow air through the equipment to help dry it out. Store the nebulizer in a clean and dust-free place. Check the filter at least one time every week. Replace the filter if it looks dirty. Follow these instructions at home Use your nebulizer  only as told by your health care provider. Do not use the nebulizer more than directed by your health care provider. Do not use any products that contain nicotine or tobacco, such as cigarettes, e-cigarettes, and chewing tobacco. If you need help quitting, ask your health care provider. Keep all follow-up visits as told by your health care provider. This is important. Where to find more information [employer]  [employer] Network: allergyasthmanetwork.org American Lung Association: www.lung.org Contact a health care provider if: You have trouble using the nebulizer. Your nebulizer foams or stops working. Your nebulizer does not create a mist after you add medicine and turn it on. Get help right away if: You continue to have trouble breathing. Your breathing gets worse during a nebulizer treatment. These symptoms may represent a serious problem that is an emergency. Do not wait to see if the symptoms will go away. Get medical help right away. Call your local emergency services (911 in the [place] Do not drive yourself to the hospital. Summary A nebulizer is a device that turns liquid medicine into a mist (vapor) that you can breathe in (inhale). Measure the liquid medicine according to instructions from your health care provider. Pour the liquid into the part of the nebulizer that holds the medicine (reservoir). Once the medicine begins to mist out, take slow, deep breaths. Rinse or wash the mouthpiece or mask and the reservoir after each use, and allow them to air-dry completely. This information is not intended to replace advice given to you by your health care provider. Make sure you discuss any questions you have with your health care provider. Document Revised: 12/12/2021 Document Reviewed: 12/12/2021 Elsevier Patient Education  2024 [employer].

## 2023-06-08 NOTE — Progress Notes (Signed)
   Subjective:    Patient ID: Jennifer [name], female    DOB: 1960-04-30, 63 y.o.   MRN: [id number]   Chief Complaint: COPD exacerbation  HPI  Patient was seen yesterday with SOB, cough and tachycardia. We ruled out PE with no change in oxygen saturation on exertion. Her sat remained at 94%. We treated her with xopenex  neb treatment , gave her steroids, symbiort inhaler and z pak. We had her return to day to make sure she is doing better. Since going home she is better but still feeling SOB. Heart no longer racing. Had to use albuterol  in middle of night but it helped. Patient Active Problem List   Diagnosis Date Noted   Smoker 01/18/2018   Osteopenia 06/10/2014   Hyperlipidemia with target LDL less than 100 05/06/2012   GAD (generalized anxiety disorder) 05/06/2012   Hypothyroidism 05/06/2012       Review of Systems  Respiratory:  Positive for cough and shortness of breath (SOME BUT BETTER).   Cardiovascular:  Negative for chest pain, palpitations and leg swelling.       Objective:   Physical Exam Constitutional:      Appearance: Normal appearance.  Cardiovascular:     Rate and Rhythm: Normal rate and regular rhythm.     Heart sounds: Normal heart sounds.  Pulmonary:     Effort: Pulmonary effort is normal. No respiratory distress.     Breath sounds: Wheezing present.  Skin:    General: Skin is warm.  Neurological:     General: No focal deficit present.     Mental Status: She is alert and oriented to person, place, and time.  Psychiatric:        Mood and Affect: Mood normal.        Behavior: Behavior normal.    BP (!) 151/85   Pulse 93   Temp 97.6 F (36.4 C) (Temporal)   Ht 5\' 3"  (1.6 m)   Wt 115 lb (52.2 kg)   SpO2 94%   BMI 20.37 kg/m         Assessment & Plan:   Jennifer [name] in today with chief complaint of Follow up COPD   1. Mucopurulent chronic bronchitis (HCC) (Primary) Will add duoneb nebulizer BID as needed NO SMOKING FOllow up monday -  ipratropium-albuterol  (DUONEB) 0.5-2.5 (3) MG/3ML SOLN; Take 3 mLs by nebulization every 2 (two) hours as needed.  Dispense: 360 mL; Refill: 0 - For home use only DME Nebulizer machine    The above assessment and management plan was discussed with the patient. The patient verbalized understanding of and has agreed to the management plan. Patient is aware to call the clinic if symptoms persist or worsen. Patient is aware when to return to the clinic for a follow-up visit. Patient educated on when it is appropriate to go to the emergency department.   Mary-Margaret Gaylyn Keas, FNP

## 2023-06-11 ENCOUNTER — Ambulatory Visit: Admitting: Nurse Practitioner

## 2023-06-11 ENCOUNTER — Encounter: Payer: Self-pay | Admitting: Nurse Practitioner

## 2023-06-11 VITALS — BP 142/79 | HR 95 | Temp 98.0°F | Ht 63.0 in | Wt 116.0 lb

## 2023-06-11 DIAGNOSIS — J410 Simple chronic bronchitis: Secondary | ICD-10-CM | POA: Insufficient documentation

## 2023-06-11 NOTE — Progress Notes (Signed)
   Subjective:    Patient ID: Erica Hau, female    DOB: [birth year], 63 y.o.   MRN: [id number]   Chief Complaint: COPD   COPD There is no shortness of breath. Pertinent negatives include no chest pain or headaches. Her past medical history is significant for COPD.    Patient was seen Thursday and Friday last week with SOB and wheezing. Dx with COPD. Was given steroids, symbicort , albuterol  and duoneb nebulizer. Is here today just to check and make sure she is improving. She says she is better. Has only been taking prednisone  1 tablet daily instead of 2 a day because make her jittery. She has not had a cigarette since last Wednesday. Patient Active Problem List   Diagnosis Date Noted   Smoker 01/18/2018   Osteopenia 06/10/2014   Hyperlipidemia with target LDL less than 100 05/06/2012   GAD (generalized anxiety disorder) 05/06/2012   Hypothyroidism 05/06/2012       Review of Systems  Constitutional:  Negative for diaphoresis.  Eyes:  Negative for pain.  Respiratory:  Negative for shortness of breath.   Cardiovascular:  Negative for chest pain, palpitations and leg swelling.  Gastrointestinal:  Negative for abdominal pain.  Endocrine: Negative for polydipsia.  Skin:  Negative for rash.  Neurological:  Negative for dizziness, weakness and headaches.  Hematological:  Does not bruise/bleed easily.  All other systems reviewed and are negative.      Objective:   Physical Exam Constitutional:      Appearance: Normal appearance.  Cardiovascular:     Rate and Rhythm: Normal rate and regular rhythm.     Heart sounds: Normal heart sounds.  Pulmonary:     Effort: Pulmonary effort is normal.     Breath sounds: Normal breath sounds. No wheezing.  Skin:    General: Skin is warm.  Neurological:     General: No focal deficit present.     Mental Status: She is alert and oriented to person, place, and time.  Psychiatric:        Mood and Affect: Mood normal.        Behavior:  Behavior normal.    BP (!) 142/79   Pulse 95   Temp 98 F (36.7 C) (Temporal)   Ht 5\' 3"  (1.6 m)   Wt 116 lb (52.6 kg)   SpO2 95%   BMI 20.55 kg/m         Assessment & Plan:   Erica Hau in today with chief complaint of COPD   1. Simple chronic bronchitis (HCC) (Primary) Finish steroids- taking 1 po daily Duoneb bid for 2 more days Finish z pak Albuterol  HFA  only when needed Do not start smoking again Continue symbicort  daily RTO for regular follow up in July    The above assessment and management plan was discussed with the patient. The patient verbalized understanding of and has agreed to the management plan. Patient is aware to call the clinic if symptoms persist or worsen. Patient is aware when to return to the clinic for a follow-up visit. Patient educated on when it is appropriate to go to the emergency department.   Mary-Margaret Gaylyn Keas, FNP

## 2023-06-26 ENCOUNTER — Ambulatory Visit: Payer: Self-pay

## 2023-06-26 MED ORDER — NYSTATIN 100000 UNIT/ML MT SUSP: 5.0000 mL | Freq: Four times a day (QID) | OROMUCOSAL | 0 refills | Status: DC

## 2023-06-26 NOTE — Telephone Encounter (Signed)
  Chief Complaint: tongue sore/white patches  Disposition: [] ED /[] Urgent Care (no appt availability in office) / [x] Appointment(In office/virtual)/ []  [facility]/ [] Home Care/ [] Refused Recommended Disposition /[] [facility]/ [x]  Follow-up with PCP Additional Notes: Pt called with concerns of sores/white patches on tongue from using inhaler. Pt has been dealing with this for several weeks. Pt is able to eat/drink. Denies fever. Pt was prescribed  Lidocaine  rinse but stated "it upsets my stomach  I don't use it. Could she call me something else in?" Pt has decline appt at this time until PCP has review this call. Pt would like another medication called without appt. Please advise. RN gave care advice and pt verbalized understanding.            Copied from CRM [id number]. Topic: Clinical - Medication Question >> Jun 26, 2023  2:57 PM [place] B wrote: Reason for CRM:  Pt called in states has white stuff on tongue and mouth is sore from using nebulizer and albuterol  and bumps toward the back of mouth. She wants to know, what she can do about it Reason for Disposition  Nursing judgment or information in reference  Answer Assessment - Initial Assessment Questions 1. REASON FOR CALL: "What is your main concern right now?"     Tongue is red, white steaks on back of tongue  2. ONSET: "When did the  start?"     Couple of weeks   4. FEVER: "Do you have a fever?"     Denies  5. OTHER SYMPTOMS: "Do you have any other new symptoms?"     Denies  6. TREATMENTS AND RESPONSE: "What have you done so far to try to make this better? What medicines have you used?"     Nothing  Protocols used: No Guideline Available-A-AH

## 2023-06-26 NOTE — Telephone Encounter (Signed)
 Please review and advise.

## 2023-06-28 ENCOUNTER — Ambulatory Visit: Admitting: Family Medicine

## 2023-06-28 ENCOUNTER — Encounter: Payer: Self-pay | Admitting: Family Medicine

## 2023-06-28 ENCOUNTER — Ambulatory Visit: Payer: Self-pay

## 2023-06-28 VITALS — BP 135/77 | HR 88 | Temp 98.5°F | Ht 63.0 in | Wt 116.0 lb

## 2023-06-28 DIAGNOSIS — B370 Candidal stomatitis: Secondary | ICD-10-CM | POA: Diagnosis not present

## 2023-06-28 MED ORDER — CLOTRIMAZOLE 10 MG MT TROC: 10.0000 mg | Freq: Every day | OROMUCOSAL | 0 refills | Status: AC

## 2023-06-28 NOTE — Progress Notes (Signed)
 Subjective:  Patient ID: Jennifer [name], female    DOB: 04/20/1960, 63 y.o.   MRN: [id number]  Patient Care Team: Delfina Feller, FNP as PCP - General (Nurse Practitioner)   Chief Complaint:  white bumps tongue/inner lip  HPI: [name] is a 63 y.o. female presenting on 06/28/2023 for white bumps tongue/inner lip  HPI States that she was seen by PCP one month ago for COPD exacerbation. She was treated with zpack, steroids, symbicort , and mouthwash, but she is not sure what the mouthwash was. She completed all medications. Now she has white patches along her tongue. She has not picked up nystatin  as she was unaware of it. She prefers troches. She is no longer using nebulizer or symbicort . She states that she quit smoking.   Relevant past medical, surgical, family, and social history reviewed and updated as indicated.  Allergies and medications reviewed and updated. Data reviewed: Chart in Epic.   Past Medical History:  Diagnosis Date   Anxiety    Hyperlipidemia    Osteopenia    Thyroid  disease     Past Surgical History:  Procedure Laterality Date   LASER ABLATION OF THE CERVIX      Social History   Socioeconomic History   Marital status: Married    Spouse name: Not on file   Number of children: Not on file   Years of education: Not on file   Highest education level: Not on file  Occupational History   Not on file  Tobacco Use   Smoking status: Every Day    Types: Cigarettes   Smokeless tobacco: Never  Substance and Sexual Activity   Alcohol use: No    Alcohol/week: 0.0 standard drinks of alcohol   Drug use: No   Sexual activity: Not on file  Other Topics Concern   Not on file  Social History Narrative   Not on file   Social Drivers of Health   Financial Resource Strain: Not on file  Food Insecurity: Not on file  Transportation Needs: Not on file  Physical Activity: Not on file  Stress: Not on file  Social Connections: Not on file  Intimate  Partner Violence: Not on file    Outpatient Encounter Medications as of 06/28/2023  Medication Sig   albuterol  (VENTOLIN  HFA) 108 (90 Base) MCG/ACT inhaler Inhale 2 puffs into the lungs every 6 (six) hours as needed for wheezing or shortness of breath.   ALPRAZolam  (XANAX ) 0.5 MG tablet Take 0.5 mg by mouth 4 (four) times daily as needed.   atorvastatin  (LIPITOR) 40 MG tablet Take 1 tablet (40 mg total) by mouth daily.   budesonide -formoterol  (SYMBICORT ) 80-4.5 MCG/ACT inhaler Inhale 2 puffs into the lungs 2 (two) times daily.   ipratropium-albuterol  (DUONEB) 0.5-2.5 (3) MG/3ML SOLN Take 3 mLs by nebulization every 2 (two) hours as needed.   levothyroxine  (SYNTHROID ) 88 MCG tablet Take 1 tablet (88 mcg total) by mouth daily.   magic mouthwash (lidocaine , diphenhydrAMINE, alum & mag hydroxide) suspension Swish and swallow 5 mLs 4 (four) times daily.   nystatin  (MYCOSTATIN ) 100000 UNIT/ML suspension Take 5 mLs (500,000 Units total) by mouth 4 (four) times daily.   [DISCONTINUED] azithromycin  (ZITHROMAX  Z-PAK) 250 MG tablet As directed   [DISCONTINUED] predniSONE  (DELTASONE ) 20 MG tablet 2 po at sametime daily for 5 days-   No facility-administered encounter medications on file as of 06/28/2023.    Allergies  Allergen Reactions   Flagyl [Metronidazole]    Ivp Dye [Iodinated Contrast Media]  Penicillins     Review of Systems As per HPI  Objective:  BP 135/77   Pulse 88   Temp 98.5 F (36.9 C)   Ht 5\' 3"  (1.6 m)   Wt 116 lb (52.6 kg)   SpO2 97%   BMI 20.55 kg/m    Wt Readings from Last 3 Encounters:  06/28/23 116 lb (52.6 kg)  06/11/23 116 lb (52.6 kg)  06/08/23 115 lb (52.2 kg)   Physical Exam Constitutional:      General: She is awake. She is not in acute distress.    Appearance: Normal appearance. She is well-developed and well-groomed. She is not ill-appearing, toxic-appearing or diaphoretic.  HENT:     Mouth/Throat:     Mouth: Oral lesions present.     Tongue: Lesions  present.     Comments: White, raised lesions along tongue and bottom lip  Cardiovascular:     Rate and Rhythm: Normal rate and regular rhythm.     Heart sounds: Normal heart sounds. No murmur heard.    No gallop.  Pulmonary:     Effort: Pulmonary effort is normal. No respiratory distress.     Breath sounds: Normal breath sounds. No stridor. No wheezing, rhonchi or rales.  Musculoskeletal:     Cervical back: Full passive range of motion without pain and neck supple.  Skin:    General: Skin is warm.     Capillary Refill: Capillary refill takes less than 2 seconds.  Neurological:     General: No focal deficit present.     Mental Status: She is alert, oriented to person, place, and time and easily aroused. Mental status is at baseline.     GCS: GCS eye subscore is 4. GCS verbal subscore is 5. GCS motor subscore is 6.     Motor: No weakness.  Psychiatric:        Attention and Perception: Attention and perception normal.        Mood and Affect: Mood and affect normal.        Speech: Speech normal.        Behavior: Behavior normal. Behavior is cooperative.        Thought Content: Thought content normal. Thought content does not include homicidal or suicidal ideation. Thought content does not include homicidal or suicidal plan.        Cognition and Memory: Cognition and memory normal.        Judgment: Judgment normal.     Results for orders placed or performed in visit on 04/17/23  Thyroid  Panel With TSH   Collection Time: 04/17/23  9:52 AM  Result Value Ref Range   TSH 0.517 0.450 - 4.500 uIU/mL   T4, Total 11.7 4.5 - 12.0 ug/dL   T3 Uptake Ratio 30 24 - 39 %   Free Thyroxine Index 3.5 1.2 - 4.9       06/28/2023   11:16 AM 06/08/2023    9:58 AM 06/07/2023    9:17 AM 02/19/2023    9:51 AM 08/17/2022    9:58 AM  Depression screen PHQ 2/9  Decreased Interest 0 0 0 0 0  Down, Depressed, Hopeless 0 0 0 0 0  PHQ - 2 Score 0 0 0 0 0       06/28/2023   11:16 AM 02/16/2022   11:15 AM  04/25/2021    9:20 AM 01/25/2021   10:05 AM  GAD 7 : Generalized Anxiety Score  Nervous, Anxious, on Edge 0 0 2 0  Control/stop worrying 0 0 1 0  Worry too much - different things 0 0 1 0  Trouble relaxing 0 0 0 0  Restless 0 0 0 0  Easily annoyed or irritable 0 0 0 0  Afraid - awful might happen 0  0 0  Total GAD 7 Score 0  4 0  Anxiety Difficulty Not difficult at all Not difficult at all Not difficult at all Not difficult at all      Pertinent labs & imaging results that were available during my care of the patient were reviewed by me and considered in my medical decision making.  Assessment & Plan:  Kaydince was seen today for white bumps tongue/inner lip.  Diagnoses and all orders for this visit:  Thrush Will start medication as below. Instructed patient to be sure to rinse out mouth well after using symbicort . Patient to not pick up nystatin .  -     clotrimazole (MYCELEX) 10 MG troche; Take 1 tablet (10 mg total) by mouth 5 (five) times daily for 7 days.     Continue all other maintenance medications.  Follow up plan: Return if symptoms worsen or fail to improve.   Continue healthy lifestyle choices, including diet (rich in fruits, vegetables, and lean proteins, and low in salt and simple carbohydrates) and exercise (at least 30 minutes of moderate physical activity daily).  Written and verbal instructions provided   The above assessment and management plan was discussed with the patient. The patient verbalized understanding of and has agreed to the management plan. Patient is aware to call the clinic if they develop any new symptoms or if symptoms persist or worsen. Patient is aware when to return to the clinic for a follow-up visit. Patient educated on when it is appropriate to go to the emergency department.   Jacqualyn Mates, DNP-FNP [facility] [address] [place], [place] [address] [phone]

## 2023-06-28 NOTE — Patient Instructions (Signed)
 Orajel magic mouthwash  Biotene

## 2023-06-28 NOTE — Telephone Encounter (Signed)
 FYI Only or Action Required?: FYI only for provider  Patient was last seen in primary care on 06/11/2023 by Delfina Feller, FNP. Called Nurse Triage reporting Mouth Lesions. Symptoms began a week ago. Interventions attempted: Nothing. Symptoms are: gradually worsening.  Triage Disposition: See PCP When Office is Open (Within 3 Days)  Patient/caregiver understands and will follow disposition?: Yes   Copied from CRM [id number]. Topic: Clinical - Red Word Triage >> Jun 28, 2023  8:52 AM Juluis Ok wrote: [facility] that prompted transfer to Nurse Triage: Thrush, yeast- worsening with pain, Reason for Disposition  [1] White patches that stick to tongue or inner cheek AND [2] can be wiped off  Answer Assessment - Initial Assessment Questions 1. SYMPTOM: "What's the main symptom you're concerned about?" (e.g., chapped lips, dry mouth, lump, sores)     White on tongue and sore 2. ONSET: "When did the    start?"     A week ago 3. PAIN: "Is there any pain?" If Yes, ask: "How bad is it?" (Scale: 1-10; mild, moderate, severe)   - MILD (1-3):  doesn't interfere with eating or normal activities   - MODERATE (4-7): interferes with eating some solids and normal activities   - SEVERE (8-10):  excruciating pain, interferes with most normal activities   - SEVERE DYSPHAGIA: can't swallow liquids, drooling     mild 4. CAUSE: "What do you think is causing the symptoms?"     inhaler 5. OTHER SYMPTOMS: "Do you have any other symptoms?" (e.g., fever, sore throat, toothache, swelling)     White tongue  Protocols used: Mouth Symptoms-A-AH

## 2023-06-28 NOTE — Telephone Encounter (Signed)
 Appointment made

## 2023-07-16 ENCOUNTER — Encounter: Admitting: Nurse Practitioner

## 2023-07-20 ENCOUNTER — Other Ambulatory Visit: Payer: Self-pay | Admitting: Nurse Practitioner

## 2023-07-20 DIAGNOSIS — E785 Hyperlipidemia, unspecified: Secondary | ICD-10-CM

## 2023-07-24 ENCOUNTER — Ambulatory Visit (INDEPENDENT_AMBULATORY_CARE_PROVIDER_SITE_OTHER): Admitting: Nurse Practitioner

## 2023-07-24 ENCOUNTER — Encounter: Payer: Self-pay | Admitting: Nurse Practitioner

## 2023-07-24 VITALS — BP 150/79 | HR 90 | Temp 97.9°F | Ht 63.0 in | Wt 117.0 lb

## 2023-07-24 DIAGNOSIS — Z0000 Encounter for general adult medical examination without abnormal findings: Secondary | ICD-10-CM | POA: Diagnosis not present

## 2023-07-24 NOTE — Patient Instructions (Signed)
 Exercising to Stay Healthy To become healthy and stay healthy, it is recommended that you do moderate-intensity and vigorous-intensity exercise. You can tell that you are exercising at a moderate intensity if your heart starts beating faster and you start breathing faster but can still hold a conversation. You can tell that you are exercising at a vigorous intensity if you are breathing much harder and faster and cannot hold a conversation while exercising. How can exercise benefit me? Exercising regularly is important. It has many health benefits, such as: Improving overall fitness, flexibility, and endurance. Increasing bone density. Helping with weight control. Decreasing body fat. Increasing muscle strength and endurance. Reducing stress and tension, anxiety, depression, or anger. Improving overall health. What guidelines should I follow while exercising? Before you start a new exercise program, talk with your health care provider. Do not exercise so much that you hurt yourself, feel dizzy, or get very short of breath. Wear comfortable clothes and wear shoes with good support. Drink plenty of water while you exercise to prevent dehydration or heat stroke. Work out until your breathing and your heartbeat get faster (moderate intensity). How often should I exercise? Choose an activity that you enjoy, and set realistic goals. Your health care provider can help you make an activity plan that is individually designed and works best for you. Exercise regularly as told by your health care provider. This may include: Doing strength training two times a week, such as: Lifting weights. Using resistance bands. Push-ups. Sit-ups. Yoga. Doing a certain intensity of exercise for a given amount of time. Choose from these options: A total of 150 minutes of moderate-intensity exercise every week. A total of 75 minutes of vigorous-intensity exercise every week. A mix of moderate-intensity and  vigorous-intensity exercise every week. Children, pregnant women, people who have not exercised regularly, people who are overweight, and older adults may need to talk with a health care provider about what activities are safe to perform. If you have a medical condition, be sure to talk with your health care provider before you start a new exercise program. What are some exercise ideas? Moderate-intensity exercise ideas include: Walking 1 mile (1.6 km) in about 15 minutes. Biking. Hiking. Golfing. Dancing. Water aerobics. Vigorous-intensity exercise ideas include: Walking 4.5 miles (7.2 km) or more in about 1 hour. Jogging or running 5 miles (8 km) in about 1 hour. Biking 10 miles (16.1 km) or more in about 1 hour. Lap swimming. Roller-skating or in-line skating. Cross-country skiing. Vigorous competitive sports, such as football, basketball, and soccer. Jumping rope. Aerobic dancing. What are some everyday activities that can help me get exercise? Yard work, such as: Child psychotherapist. Raking and bagging leaves. Washing your car. Pushing a stroller. Shoveling snow. Gardening. Washing windows or floors. How can I be more active in my day-to-day activities? Use stairs instead of an elevator. Take a walk during your lunch break. If you drive, park your car farther away from your work or school. If you take public transportation, get off one stop early and walk the rest of the way. Stand up or walk around during all of your indoor phone calls. Get up, stretch, and walk around every 30 minutes throughout the day. Enjoy exercise with a friend. Support to continue exercising will help you keep a regular routine of activity. Where to find more information You can find more information about exercising to stay healthy from: U.S. Department of Health and Human Services: ThisPath.fi Centers for Disease Control and Prevention (  CDC): FootballExhibition.com.br Summary Exercising regularly is  important. It will improve your overall fitness, flexibility, and endurance. Regular exercise will also improve your overall health. It can help you control your weight, reduce stress, and improve your bone density. Do not exercise so much that you hurt yourself, feel dizzy, or get very short of breath. Before you start a new exercise program, talk with your health care provider. This information is not intended to replace advice given to you by your health care provider. Make sure you discuss any questions you have with your health care provider. Document Revised: 05/07/2020 Document Reviewed: 05/07/2020 Elsevier Patient Education  2024 [employer].

## 2023-07-24 NOTE — Progress Notes (Signed)
 Subjective:    Jennifer [name] is a 63 y.o. female who presents for a Welcome to Medicare exam.   Cardiac Risk Factors include: advanced age (>66men, >69 women);dyslipidemia;smoking/ tobacco exposure      Objective:    Today's Vitals   07/24/23 1129 07/24/23 1130 07/24/23 1132  BP: (!) 145/79 (!) 150/79   Pulse: 90    Temp: 97.9 F (36.6 C)    TempSrc: Temporal    SpO2: 97%    Weight: 117 lb (53.1 kg)    Height: 5' 3 (1.6 m)    PainSc:   7   Body mass index is 20.73 kg/m.  Medications Outpatient Encounter Medications as of 07/24/2023  Medication Sig   albuterol  (VENTOLIN  HFA) 108 (90 Base) MCG/ACT inhaler Inhale 2 puffs into the lungs every 6 (six) hours as needed for wheezing or shortness of breath.   ALPRAZolam  (XANAX ) 0.5 MG tablet Take 0.5 mg by mouth 4 (four) times daily as needed.   atorvastatin  (LIPITOR) 40 MG tablet Take 1 tablet (40 mg total) by mouth daily.   budesonide -formoterol  (SYMBICORT ) 80-4.5 MCG/ACT inhaler Inhale 2 puffs into the lungs 2 (two) times daily.   ipratropium-albuterol  (DUONEB) 0.5-2.5 (3) MG/3ML SOLN Take 3 mLs by nebulization every 2 (two) hours as needed.   levothyroxine  (SYNTHROID ) 88 MCG tablet Take 1 tablet (88 mcg total) by mouth daily.   magic mouthwash (lidocaine , diphenhydrAMINE, alum & mag hydroxide) suspension Swish and swallow 5 mLs 4 (four) times daily.   No facility-administered encounter medications on file as of 07/24/2023.     History: Past Medical History:  Diagnosis Date   Anxiety    Hyperlipidemia    osteoperosis    Thyroid  disease    Past Surgical History:  Procedure Laterality Date   LASER ABLATION OF THE CERVIX      Family History  Problem Relation Age of Onset   Stroke Mother    Kidney disease Mother    Hip fracture Mother    Emphysema Father    Cancer Father    Heart disease Sister    Thyroid  disease Sister    Healthy Brother    Social History   Occupational History   Occupation: Disabled  Tobacco  Use   Smoking status: Former    Current packs/day: 0.00    Types: Cigarettes    Quit date: 06/24/2023    Years since quitting: 0.0   Smokeless tobacco: Never  Vaping Use   Vaping status: Never Used  Substance and Sexual Activity   Alcohol use: No    Alcohol/week: 0.0 standard drinks of alcohol   Drug use: No   Sexual activity: Not on file    Tobacco Counseling Counseling given: No   Immunizations and Health Maintenance  There is no immunization history on file for this patient. Health Maintenance Due  Topic Date Due   Medicare Annual Wellness (AWV)  Never done   DTaP/Tdap/Td (1 - Tdap) Never done   Pneumococcal Vaccine: 50+ Years (1 of 2 - PCV) Never done   Zoster Vaccines- Shingrix (1 of 2) Never done   COVID-19 Vaccine (1 - 2024-25 season) Never done   LIPID PANEL  05/20/2023    Activities of Daily Living    07/24/2023   11:34 AM  In your present state of health, do you have any difficulty performing the following activities:  Hearing? 0  Vision? 0  Difficulty concentrating or making decisions? 0  Walking or climbing stairs? 0  Dressing or bathing?  0  Doing errands, shopping? 0  Preparing Food and eating ? N  Using the Toilet? N  In the past six months, have you accidently leaked urine? N  Do you have problems with loss of bowel control? N  Managing your Medications? N  Managing your Finances? N  Housekeeping or managing your Housekeeping? N    Physical Exam   Physical Exam (optional), or other factors deemed appropriate based on the beneficiary's medical and social history and current clinical standards.   Advanced Directives: Does Patient Have a Medical Advance Directive?: No Would patient like information on creating a medical advance directive?: No - Patient declined  EKG:  Not needed today      Assessment:    This is a routine wellness examination for this patient . Welcome to medicare  Vision/Hearing screen No results found.   Goals       DIET - EAT MORE FRUITS AND VEGETABLES     Exercise 150 min/wk Moderate Activity        Depression Screen    06/28/2023   11:16 AM 06/08/2023    9:58 AM 06/07/2023    9:17 AM 02/19/2023    9:51 AM  PHQ 2/9 Scores  PHQ - 2 Score 0 0 0 0     Fall Risk    06/28/2023   11:17 AM  Fall Risk   Falls in the past year? 0  Number falls in past yr: 0  Injury with Fall? 0  Risk for fall due to : No Fall Risks  Follow up Falls evaluation completed    Cognitive Function:        07/24/2023   11:46 AM  6CIT Screen  What Year? 0 points  What month? 0 points  What time? 0 points  Count back from 20 0 points  Months in reverse 4 points  Repeat phrase 0 points  Total Score 4 points    Patient Care Team: Jennifer Mustard, FNP as PCP - General (Nurse Practitioner)     Plan:   Welcome to medicare  I have personally reviewed and noted the following in the patient's chart:   Medical and social history Use of alcohol, tobacco or illicit drugs  Current medications and supplements including opioid prescriptions. Patient is currently taking opioid prescriptions. Information provided to patient regarding non-opioid alternatives. Patient advised to discuss non-opioid treatment plan with their provider. Functional ability and status Nutritional status Physical activity Advanced directives List of other physicians Hospitalizations, surgeries, and ER visits in previous 12 months Vitals Screenings to include cognitive, depression, and falls Referrals and appointments  In addition, I have reviewed and discussed with patient certain preventive protocols, quality metrics, and best practice recommendations. A written personalized care plan for preventive services as well as general preventive health recommendations were provided to patient.     Jennifer [name], [place] 07/24/2023

## 2023-08-14 ENCOUNTER — Other Ambulatory Visit: Payer: Self-pay | Admitting: Nurse Practitioner

## 2023-08-21 ENCOUNTER — Encounter: Payer: Self-pay | Admitting: Nurse Practitioner

## 2023-08-21 ENCOUNTER — Ambulatory Visit (INDEPENDENT_AMBULATORY_CARE_PROVIDER_SITE_OTHER): Payer: Medicaid Other | Admitting: Nurse Practitioner

## 2023-08-21 VITALS — BP 134/77 | HR 91 | Temp 97.6°F | Ht 63.0 in | Wt 118.0 lb

## 2023-08-21 DIAGNOSIS — E785 Hyperlipidemia, unspecified: Secondary | ICD-10-CM

## 2023-08-21 DIAGNOSIS — F411 Generalized anxiety disorder: Secondary | ICD-10-CM | POA: Diagnosis not present

## 2023-08-21 DIAGNOSIS — E039 Hypothyroidism, unspecified: Secondary | ICD-10-CM

## 2023-08-21 DIAGNOSIS — F17200 Nicotine dependence, unspecified, uncomplicated: Secondary | ICD-10-CM

## 2023-08-21 DIAGNOSIS — M8588 Other specified disorders of bone density and structure, other site: Secondary | ICD-10-CM | POA: Diagnosis not present

## 2023-08-21 MED ORDER — ATORVASTATIN CALCIUM 40 MG PO TABS
40.0000 mg | ORAL_TABLET | Freq: Every day | ORAL | 1 refills | Status: AC
Start: 2023-08-21 — End: ?

## 2023-08-21 MED ORDER — LEVOTHYROXINE SODIUM 88 MCG PO TABS: 88.0000 ug | ORAL_TABLET | Freq: Every day | ORAL | 1 refills | Status: DC

## 2023-08-21 NOTE — Patient Instructions (Signed)
 Bone Health Bones protect organs, store calcium, anchor muscles, and support the whole body. Keeping your bones strong is important, especially as you get older. You can take actions to help keep your bones strong and healthy. Why is keeping my bones healthy important?  Keeping your bones healthy is important because your body constantly replaces bone cells. Cells get old, and new cells take their place. As we age, we lose bone cells because the body may not be able to make enough new cells to replace the old cells. The amount of bone cells and bone tissue you have is referred to as bone mass. The higher your bone mass, the stronger your bones. The aging process leads to an overall loss of bone mass in the body, which can increase the likelihood of: Broken bones. A condition in which the bones become weak and brittle (osteoporosis). A large decline in bone mass occurs in older adults. In women, it occurs about the time of menopause. What actions can I take to keep my bones healthy? Good health habits are important for maintaining healthy bones. This includes eating nutritious foods and exercising regularly. To have healthy bones, you need to get enough of the right minerals and vitamins. Most nutrition experts recommend getting these nutrients from the foods that you eat. In some cases, taking supplements may also be recommended. Doing certain types of exercise is also important for bone health. What are the nutritional recommendations for healthy bones?  Eating a well-balanced diet with plenty of calcium and vitamin D will help to protect your bones. Nutritional recommendations vary from person to person. Ask your health care provider what is healthy for you. Here are some general guidelines. Get enough calcium Calcium is the most important (essential) mineral for bone health. Most people can get enough calcium from their diet, but supplements may be recommended for people who are at risk for  osteoporosis. Good sources of calcium include: Dairy products, such as low-fat or nonfat milk, cheese, and yogurt. Dark green leafy vegetables, such as bok choy and broccoli. Foods that have calcium added to them (are fortified). Foods that may be fortified with calcium include orange juice, cereal, bread, soy beverages, and tofu products. Nuts, such as almonds. Follow these recommended amounts for daily calcium intake: Infants, 0-6 months: 200 mg. Infants, 6-12 months: 260 mg. Children, age 647-3: 700 mg. Children, age 64-8: 1,000 mg. Children, age 642-13: 1,300 mg. Teens, age 38-18: 1,300 mg. Adults, age 65-50: 1,000 mg. Adults, age 23-70: Men: 1,000 mg. Women: 1,200 mg. Adults, age 97 or older: 1,200 mg. Pregnant and breastfeeding females: Teens: 1,300 mg. Adults: 1,000 mg. Get enough vitamin D Vitamin D is the most essential vitamin for bone health. It helps the body absorb calcium. Sunlight stimulates the skin to make vitamin D, so be sure to get enough sunlight. If you live in a cold climate or you do not get outside often, your health care provider may recommend that you take vitamin D supplements. Good sources of vitamin D in your diet include: Egg yolks. Saltwater fish. Milk and cereal fortified with vitamin D. Follow these recommended amounts for daily vitamin D intake: Infants, 0-12 months: 400 international units (IU). Children and teens, age 647-18: 600 international units. Adults, age 31 or younger: 600 international units. Adults, age 9 or older: 600-1,000 international units. Get other important nutrients Other nutrients that are important for bone health include: Phosphorus. This mineral is found in meat, poultry, dairy foods, nuts, and legumes. The  recommended daily intake for adult men and adult women is 700 mg. Magnesium. This mineral is found in seeds, nuts, dark green vegetables, and legumes. The recommended daily intake for adult men is 400-420 mg. For adult women,  it is 310-320 mg. Vitamin K. This vitamin is found in green leafy vegetables. The recommended daily intake is 120 mcg for adult men and 90 mcg for adult women. What type of physical activity is best for building and maintaining healthy bones? Weight-bearing and strength-building activities are important for building and maintaining healthy bones. Weight-bearing activities cause muscles and bones to work against gravity. Strength-building activities increase the strength of the muscles that support bones. Weight-bearing and muscle-building activities include: Walking and hiking. Jogging and running. Dancing. Gym exercises. Lifting weights. Tennis and racquetball. Climbing stairs. Aerobics. Adults should get at least 30 minutes of moderate physical activity on most days. Children should get at least 60 minutes of moderate physical activity on most days. Ask your health care provider what type of exercise is best for you. How can I find out if my bone mass is low? Bone mass can be measured with an X-ray test called a bone mineral density (BMD) test. This test is recommended for all women who are age 19 or older. It may also be recommended for: Men who are age 55 or older. People who are at risk for osteoporosis because of: Having a long-term disease that weakens bones, such as kidney disease or rheumatoid arthritis. Having menopause earlier than normal. Taking medicine that weakens bones, such as steroids, thyroid hormones, or hormone treatment for breast cancer or prostate cancer. Smoking. Drinking three or more alcoholic drinks a day. Being underweight. Sedentary lifestyle. If you find that you have a low bone mass, you may be able to prevent osteoporosis or further bone loss by changing your diet and lifestyle. Where can I find more information? Bone Health & Osteoporosis Foundation: https://carlson-fletcher.info/ [employer]: www.bones.http://www.myers.net/ International Osteoporosis  Foundation: Investment banker, operational.iofbonehealth.org Summary The aging process leads to an overall loss of bone mass in the body, which can increase the likelihood of broken bones and osteoporosis. Eating a well-balanced diet with plenty of calcium and vitamin D will help to protect your bones. Weight-bearing and strength-building activities are also important for building and maintaining strong bones. Bone mass can be measured with an X-ray test called a bone mineral density (BMD) test. This information is not intended to replace advice given to you by your health care provider. Make sure you discuss any questions you have with your health care provider. Document Revised: 06/23/2020 Document Reviewed: 06/23/2020 Elsevier Patient Education  2024 [employer].

## 2023-08-21 NOTE — Progress Notes (Signed)
 Subjective:    Patient ID: Jennifer [name], female    DOB: October 01, 1960, 63 y.o.   MRN: [id number]   Chief Complaint: medical management of chronic issues     HPI:  Jennifer [name] is a 63 y.o. who identifies as a female who was assigned female at birth.   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 Hyperlipidemia with target LDL less than 100 Does watchdiet and stays active. Lab Results  Component Value Date   CHOL 129 02/19/2023   HDL 46 02/19/2023   LDLCALC 65 02/19/2023   TRIG 97 02/19/2023   CHOLHDL 2.8 02/19/2023   The ASCVD Risk score (Arnett DK, et al., 2019) failed to calculate for the following reasons:   The valid total cholesterol range is 130 to 320 mg/dL   2. Acquired hypothyroidism No issues that she is aware of. Lab Results  Component Value Date   TSH 0.517 04/17/2023     3. GAD (generalized anxiety disorder) Is on xanax  4x a day and has been for awhile. Sees psych every few months    06/28/2023   11:16 AM 02/16/2022   11:15 AM 04/25/2021    9:20 AM 01/25/2021   10:05 AM  GAD 7 : Generalized Anxiety Score  Nervous, Anxious, on Edge 0 0 2 0  Control/stop worrying 0 0 1 0  Worry too much - different things 0 0 1 0  Trouble relaxing 0 0 0 0  Restless 0 0 0 0  Easily annoyed or irritable 0 0 0 0  Afraid - awful might happen 0  0 0  Total GAD 7 Score 0  4 0  Anxiety Difficulty Not difficult at all Not difficult at all Not difficult at all Not difficult at all     4. Osteopenia of lumbar spine Last dexascan was done on 07/28/21. T score was -2.4. she is scared to take fosamax . Does not want to repeat today.  5. Smoker Patient quit smoking in July and is doing well.    New complaints: None today  Allergies  Allergen Reactions   Flagyl [Metronidazole]    Ivp Dye [Iodinated Contrast Media]    Penicillins    Outpatient Encounter Medications as of  08/21/2023  Medication Sig   albuterol  (VENTOLIN  HFA) 108 (90 Base) MCG/ACT inhaler Inhale 2 puffs into the lungs every 6 (six) hours as needed for wheezing or shortness of breath.   ALPRAZolam  (XANAX ) 0.5 MG tablet Take 0.5 mg by mouth 4 (four) times daily as needed.   atorvastatin  (LIPITOR) 40 MG tablet Take 1 tablet (40 mg total) by mouth daily.   budesonide -formoterol  (SYMBICORT ) 80-4.5 MCG/ACT inhaler Inhale 2 puffs into the lungs 2 (two) times daily.   ipratropium-albuterol  (DUONEB) 0.5-2.5 (3) MG/3ML SOLN Take 3 mLs by nebulization every 2 (two) hours as needed.   levothyroxine  (SYNTHROID ) 88 MCG tablet TAKE ONE TABLET BY MOUTH EVERY DAY   magic mouthwash (lidocaine , diphenhydrAMINE, alum & mag hydroxide) suspension Swish and swallow 5 mLs 4 (four) times daily.   No facility-administered encounter medications on file as of 08/21/2023.    Past Surgical History:  Procedure Laterality Date   LASER ABLATION OF THE CERVIX      Family History  Problem Relation Age of Onset   Stroke Mother    Kidney disease Mother    Hip fracture Mother    Emphysema Father  Cancer Father    Heart disease Sister    Thyroid  disease Sister    Healthy Brother       Controlled substance contract: n/a     Review of Systems  Constitutional:  Negative for diaphoresis.  Eyes:  Negative for pain.  Respiratory:  Negative for shortness of breath.   Cardiovascular:  Negative for chest pain, palpitations and leg swelling.  Gastrointestinal:  Negative for abdominal pain.  Endocrine: Negative for polydipsia.  Skin:  Negative for rash.  Neurological:  Negative for dizziness, weakness and headaches.  Hematological:  Does not bruise/bleed easily.  All other systems reviewed and are negative.      Objective:   Physical Exam Vitals and nursing note reviewed.  Constitutional:      General: She is not in acute distress.    Appearance: Normal appearance. She is well-developed.  HENT:     Head:  Normocephalic.     Right Ear: Tympanic membrane normal.     Left Ear: Tympanic membrane normal.     Nose: Nose normal.     Mouth/Throat:     Mouth: Mucous membranes are moist.  Eyes:     Pupils: Pupils are equal, round, and reactive to light.  Neck:     Vascular: No carotid bruit or JVD.  Cardiovascular:     Rate and Rhythm: Normal rate and regular rhythm.     Heart sounds: Normal heart sounds.  Pulmonary:     Effort: Pulmonary effort is normal. No respiratory distress.     Breath sounds: Normal breath sounds. No wheezing or rales.  Chest:     Chest wall: No tenderness.  Abdominal:     General: Bowel sounds are normal. There is no distension or abdominal bruit.     Palpations: Abdomen is soft. There is no hepatomegaly, splenomegaly, mass or pulsatile mass.     Tenderness: There is no abdominal tenderness.  Musculoskeletal:        General: Normal range of motion.     Cervical back: Normal range of motion and neck supple.  Lymphadenopathy:     Cervical: No cervical adenopathy.  Skin:    General: Skin is warm and dry.  Neurological:     Mental Status: She is alert and oriented to person, place, and time.     Deep Tendon Reflexes: Reflexes are normal and symmetric.  Psychiatric:        Behavior: Behavior normal.        Thought Content: Thought content normal.        Judgment: Judgment normal.    BP 134/77   Pulse 91   Temp 97.6 F (36.4 C) (Temporal)   Ht 5' 3 (1.6 m)   Wt 118 lb (53.5 kg)   SpO2 97%   BMI 20.90 kg/m          Assessment & Plan:  Jennifer [name] comes in today with chief complaint of medical management of chronic issues    Diagnosis and orders addressed:  1. Hyperlipidemia with target LDL less than 100 Low fat diet - atorvastatin  (LIPITOR) 40 MG tablet; Take 1 tablet (40 mg total) by mouth daily.  Dispense: 90 tablet; Refill: 1 - CBC with Differential/Platelet - CMP14+EGFR - Lipid panel  2. Acquired hypothyroidism Labs pending -  levothyroxine  (SYNTHROID ) 75 MCG tablet; Take 1 tablet (75 mcg total) by mouth daily before breakfast.  Dispense: 90 tablet; Refill: 1 - Thyroid  Panel With TSH  3. GAD (generalized anxiety disorder) Continue follow up  with psych  4. Osteopenia of lumbar spine Refuses treatment with anything other than calcium  and vitamin d  supplement Weight bearing exercises encouraged  5. Smoker Smoking cessation encouraged Refuse low dose ct san   Labs pending Health Maintenance reviewed Diet and exercise encouraged  Follow up plan: 6 months   [name], FNP

## 2023-08-22 LAB — CMP14+EGFR
ALT: 15 IU/L (ref 0–32)
AST: 22 IU/L (ref 0–40)
Albumin: 4.2 g/dL (ref 3.9–4.9)
Alkaline Phosphatase: 126 IU/L — ABNORMAL HIGH (ref 44–121)
BUN/Creatinine Ratio: 9 — ABNORMAL LOW (ref 12–28)
BUN: 7 mg/dL — ABNORMAL LOW (ref 8–27)
Bilirubin Total: 0.5 mg/dL (ref 0.0–1.2)
CO2: 24 mmol/L (ref 20–29)
Calcium: 9.3 mg/dL (ref 8.7–10.3)
Chloride: 103 mmol/L (ref 96–106)
Creatinine, Ser: 0.78 mg/dL (ref 0.57–1.00)
Globulin, Total: 2 g/dL (ref 1.5–4.5)
Glucose: 97 mg/dL (ref 70–99)
Potassium: 4.8 mmol/L (ref 3.5–5.2)
Sodium: 140 mmol/L (ref 134–144)
Total Protein: 6.2 g/dL (ref 6.0–8.5)
eGFR: 84 mL/min/1.73 (ref >59)

## 2023-08-22 LAB — THYROID PANEL WITH TSH
Free Thyroxine Index: 3.5 (ref 1.2–4.9)
T3 Uptake Ratio: 34 (ref 24–39)
T4, Total: 10.3 ug/dL (ref 4.5–12.0)
TSH: 0.207 u[IU]/mL — ABNORMAL LOW (ref 0.450–4.500)

## 2023-08-22 LAB — LIPID PANEL
Chol/HDL Ratio: 2.4 ratio (ref 0.0–4.4)
Cholesterol, Total: 106 mg/dL (ref 100–199)
HDL: 45 mg/dL (ref >39)
LDL Chol Calc (NIH): 48 mg/dL (ref 0–99)
Triglycerides: 55 mg/dL (ref 0–149)
VLDL Cholesterol Cal: 13 mg/dL (ref 5–40)

## 2023-08-22 LAB — CBC WITH DIFFERENTIAL/PLATELET
Basophils Absolute: 0 x10E3/uL (ref 0.0–0.2)
Basos: 1 %
EOS (ABSOLUTE): 0.1 x10E3/uL (ref 0.0–0.4)
Eos: 1 %
Hematocrit: 41 % (ref 34.0–46.6)
Hemoglobin: 13.7 g/dL (ref 11.1–15.9)
Immature Grans (Abs): 0 x10E3/uL (ref 0.0–0.1)
Immature Granulocytes: 0 %
Lymphocytes Absolute: 1.3 x10E3/uL (ref 0.7–3.1)
Lymphs: 33 %
MCH: 34 pg — ABNORMAL HIGH (ref 26.6–33.0)
MCHC: 33.4 g/dL (ref 31.5–35.7)
MCV: 102 fL — ABNORMAL HIGH (ref 79–97)
Monocytes Absolute: 0.4 x10E3/uL (ref 0.1–0.9)
Monocytes: 9 %
Neutrophils Absolute: 2.1 x10E3/uL (ref 1.4–7.0)
Neutrophils: 56 %
Platelets: 180 x10E3/uL (ref 150–450)
RBC: 4.03 x10E6/uL (ref 3.77–5.28)
RDW: 12.6 % (ref 11.7–15.4)
WBC: 3.9 x10E3/uL (ref 3.4–10.8)

## 2023-08-23 ENCOUNTER — Ambulatory Visit: Payer: Self-pay | Admitting: Nurse Practitioner

## 2023-08-23 MED ORDER — LEVOTHYROXINE SODIUM 75 MCG PO TABS: 75.0000 ug | ORAL_TABLET | Freq: Every day | ORAL | 1 refills | Status: DC

## 2023-08-23 NOTE — Addendum Note (Signed)
 Addended by: Kamarian Sahakian, MARY-MARGARET on: 08/23/2023 07:39 AM   Modules accepted: Orders

## 2023-08-30 MED ORDER — LEVOTHYROXINE SODIUM 75 MCG PO TABS: 75.0000 ug | ORAL_TABLET | Freq: Every day | ORAL | 3 refills | Status: DC

## 2023-09-20 ENCOUNTER — Other Ambulatory Visit: Payer: Self-pay | Admitting: Family Medicine

## 2023-09-20 ENCOUNTER — Telehealth: Payer: Self-pay | Admitting: Nurse Practitioner

## 2023-09-20 DIAGNOSIS — E039 Hypothyroidism, unspecified: Secondary | ICD-10-CM

## 2023-09-20 NOTE — Telephone Encounter (Signed)
 Orders placed.

## 2023-09-20 NOTE — Telephone Encounter (Signed)
 Patient has lab appt 09-27-2023 for repeat labs and needs orders put in for TSH.

## 2023-09-27 ENCOUNTER — Other Ambulatory Visit

## 2023-09-27 DIAGNOSIS — E039 Hypothyroidism, unspecified: Secondary | ICD-10-CM

## 2023-09-28 ENCOUNTER — Ambulatory Visit: Payer: Self-pay | Admitting: Nurse Practitioner

## 2023-09-28 LAB — TSH: TSH: 0.438 u[IU]/mL — ABNORMAL LOW (ref 0.450–4.500)

## 2023-10-02 ENCOUNTER — Telehealth: Payer: Self-pay

## 2023-10-02 NOTE — Telephone Encounter (Signed)
 Duplicate message

## 2023-10-02 NOTE — Telephone Encounter (Signed)
 Copied from CRM #[id number]. Topic: Clinical - Lab/Test Results >> Oct 02, 2023  9:19 AM Laurier C wrote: Reason for CRM: Patient calling to discuss her lab results after missing a few calls last week. Patient was advised a letter with the results have been mailed out. Mady from the office states she will give the patient a call back to discuss the results.

## 2023-10-02 NOTE — Telephone Encounter (Signed)
 Called and spoke with patient and gave her lab results. Patient notified and verbalized understanding

## 2023-10-02 NOTE — Telephone Encounter (Signed)
 Attempted to contact patient twice and number was busy both times

## 2023-10-02 NOTE — Telephone Encounter (Signed)
 Copied from CRM #[id number]. Topic: General - Other >> Oct 02, 2023  9:27 AM Carlatta H wrote: Reason for CRM: Advised of case created and she will receive a call today >> Oct 02, 2023 12:11 PM Emylou G wrote: Adv patient of lab results

## 2023-11-26 ENCOUNTER — Other Ambulatory Visit: Payer: Self-pay | Admitting: *Deleted

## 2023-11-26 DIAGNOSIS — J209 Acute bronchitis, unspecified: Secondary | ICD-10-CM

## 2024-02-14 ENCOUNTER — Ambulatory Visit: Admitting: Family Medicine

## 2024-02-14 ENCOUNTER — Other Ambulatory Visit: Payer: Self-pay | Admitting: Nurse Practitioner

## 2024-02-14 ENCOUNTER — Encounter: Payer: Self-pay | Admitting: Family Medicine

## 2024-02-14 ENCOUNTER — Ambulatory Visit: Payer: Self-pay

## 2024-02-14 VITALS — BP 136/75 | HR 89 | Temp 98.7°F | Ht 63.0 in | Wt 120.6 lb

## 2024-02-14 DIAGNOSIS — L03213 Periorbital cellulitis: Secondary | ICD-10-CM | POA: Diagnosis not present

## 2024-02-14 DIAGNOSIS — B370 Candidal stomatitis: Secondary | ICD-10-CM | POA: Diagnosis not present

## 2024-02-14 DIAGNOSIS — H1032 Unspecified acute conjunctivitis, left eye: Secondary | ICD-10-CM

## 2024-02-14 MED ORDER — CLOTRIMAZOLE 10 MG MT TROC: 10.0000 mg | Freq: Every day | OROMUCOSAL | 0 refills | Status: AC

## 2024-02-14 MED ORDER — POLYMYXIN B-TRIMETHOPRIM 10000-0.1 UNIT/ML-% OP SOLN: 1.0000 [drp] | Freq: Four times a day (QID) | OPHTHALMIC | 0 refills | Status: AC

## 2024-02-14 MED ORDER — LEVOFLOXACIN 500 MG PO TABS: 500.0000 mg | ORAL_TABLET | Freq: Every day | ORAL | 0 refills | Status: AC

## 2024-02-14 NOTE — Progress Notes (Signed)
 "  Acute Office Visit  Subjective:     Patient ID: Jennifer [name], female    DOB: Feb 21, 1960, 64 y.o.   MRN: [id number]  Chief Complaint  Patient presents with   Belepharitis    HPI  History of Present Illness   Jennifer [name] is a 64 year old female who presents with swelling and redness under her left eye.  Periorbital swelling and erythema - Swelling and redness under the left eye began yesterday morning - Swelling has increased and extended downward over cheek - Swelling feels spongy and soft, not firm - Associated with itching and a sensation of fine sand in eye - No pain with eye movement - No changes in vision - No drainage from the eye - No recent change in makeup or facial products - No recent exposure to individuals with conjunctivitis  Antibiotic allergy and concerns - Allergy to penicillin, characterized by hives - Never had cephalosporins - Concerned about developing thrush, which typically occurs with abx use  Respiratory symptoms and history - No recent cough or congestion       ROS As per HPI.      Objective:    BP 136/75   Pulse 89   Temp 98.7 F (37.1 C) (Temporal)   Ht 5' 3 (1.6 m)   Wt 120 lb 9.6 oz (54.7 kg)   SpO2 97%   BMI 21.36 kg/m    Physical Exam Vitals and nursing note reviewed.  Constitutional:      General: She is not in acute distress.    Appearance: She is not ill-appearing, toxic-appearing or diaphoretic.  Eyes:     General: Vision grossly intact. Gaze aligned appropriately.     Extraocular Movements:     Right eye: Normal extraocular motion.     Left eye: Normal extraocular motion.     Conjunctiva/sclera:     Left eye: Left conjunctiva is injected.     Comments: Erythema with minimal warmth to left upper check. No tenderness or exudate.   Pulmonary:     Effort: Pulmonary effort is normal. No respiratory distress.  Skin:    General: Skin is warm and dry.  Neurological:     General: No focal deficit present.      Mental Status: She is alert and oriented to person, place, and time.  Psychiatric:        Mood and Affect: Mood normal.        Behavior: Behavior normal.     No results found for any visits on 02/14/24.      Assessment & Plan:   [name] was seen today for belepharitis.  Diagnoses and all orders for this visit:  Periorbital cellulitis of left eye -     levofloxacin  (LEVAQUIN ) 500 MG tablet; Take 1 tablet (500 mg total) by mouth daily for 7 days.  Acute bacterial conjunctivitis of left eye -     trimethoprim -polymyxin b  (POLYTRIM ) ophthalmic solution; Place 1 drop into the left eye 4 (four) times daily for 7 days.  Thrush -     clotrimazole  (MYCELEX ) 10 MG troche; Take 1 tablet (10 mg total) by mouth 5 (five) times daily for 7 days.   Assessment and Plan    Periorbital cellulitis of the left eye Acute periorbital cellulitis with swelling and redness. Risk of progression to orbital cellulitis. No fever or significant pain on eye movement. - Prescribed levofloxacin  500 mg once daily due to penicillin allergy. - Advised to monitor for fever or increased pain  with eye movement. - Instructed to seek emergency care if symptoms worsen- fever, changes in vision, pain with eye movement  Acute bacterial conjunctivitis of the left eye Acute bacterial conjunctivitis with redness and swelling. Contagious condition. - Prescribed Polytrim  eye drops, one drop in the left eye four times a day for one week. - Advised on hygiene measures to prevent spread.  Candidal stomatitis (oral thrush) Hx of oral thrush associated with antibiotic use causing significant discomfort. - Clotrimazole  Rx provided if symptoms occur.      Return to office for new or worsening symptoms, or if symptoms persist.   The patient indicates understanding of these issues and agrees with the plan.  Jennifer [name] Search, FNP   "

## 2024-02-14 NOTE — Telephone Encounter (Signed)
" °  FYI Only or Action Required?: FYI only for provider: appointment scheduled on 1/23.  Patient was last seen in primary care on 08/21/2023 by Gladis Mustard, FNP.  Called Nurse Triage reporting No chief complaint on file..  Symptoms began yesterday.  Interventions attempted: Nothing.  Symptoms are: stable.  Triage Disposition: No disposition on file.  Patient/caregiver understands and will follow disposition?:  Reason for Triage: PT has LT eye swollen/Above cheek spot that is swollen and red/since 01/21 /Eye pain to touch/Itching/Red inside eye   Reason for Disposition  MODERATE-SEVERE eyelid swelling on one side  (Exception: Due to a mosquito bite.)  Answer Assessment - Initial Assessment Questions 1. ONSET: When did the swelling start? (e.g., minutes, hours, days)     Yesterday 2. LOCATION: What part of the eyelids is swollen?     Lower lid 3. SEVERITY: How swollen is it?     Mild to moderate, opening eye normally 4. ITCHING: Is there any itching? If Yes, ask: How much?   (Scale 1-10; mild, moderate or severe)     mild 5. PAIN: Is the swelling painful to touch? If Yes, ask: How painful is it?   (Scale 1-10; mild, moderate or severe)     Painful when pressed on 6. FEVER: Do you have a fever? If Yes, ask: What is it, how was it measured, and when did it start?      denies 7. CAUSE: What do you think is causing the swelling?     unknown 8. RECURRENT SYMPTOM: Have you had eyelid swelling before? If Yes, ask: When was the last time? What happened that time?      9. OTHER SYMPTOMS: Do you have any other symptoms? (e.g., blurred vision, eye discharge, rash, runny nose)     Denies discharge, fever  Protocols used: Eye - Swelling-A-AH  "

## 2024-02-18 ENCOUNTER — Ambulatory Visit: Payer: Self-pay | Admitting: Nurse Practitioner

## 2024-02-25 ENCOUNTER — Ambulatory Visit: Admitting: Nurse Practitioner

## 2024-03-14 ENCOUNTER — Ambulatory Visit: Admitting: Nurse Practitioner
